# Patient Record
Sex: Male | Born: 1990 | Race: Black or African American | Hispanic: No | Marital: Single | Smoking: Never smoker
Health system: Southern US, Community
[De-identification: ages and names within clinical notes are randomized; demographics above are authoritative.]

## PROBLEM LIST (undated history)

## (undated) DIAGNOSIS — B029 Zoster without complications: Secondary | ICD-10-CM

## (undated) HISTORY — DX: Zoster without complications: B02.9

---
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
## 2008-07-24 ENCOUNTER — Emergency Department (HOSPITAL_BASED_OUTPATIENT_CLINIC_OR_DEPARTMENT_OTHER): Admission: EM | Admit: 2008-07-24 | Discharge: 2008-07-24 | Payer: Self-pay | Admitting: Emergency Medicine

## 2008-07-24 ENCOUNTER — Ambulatory Visit: Payer: Self-pay | Admitting: Diagnostic Radiology

## 2011-08-09 ENCOUNTER — Encounter (HOSPITAL_COMMUNITY): Payer: Self-pay | Admitting: *Deleted

## 2011-08-09 ENCOUNTER — Emergency Department (HOSPITAL_COMMUNITY)
Admission: EM | Admit: 2011-08-09 | Discharge: 2011-08-09 | Disposition: A | Discharge status: Home / Self Care | Payer: Medicaid Other | Attending: Emergency Medicine | Admitting: Emergency Medicine

## 2011-08-09 DIAGNOSIS — R51 Headache: Secondary | ICD-10-CM | POA: Insufficient documentation

## 2011-08-09 DIAGNOSIS — R5381 Other malaise: Secondary | ICD-10-CM | POA: Insufficient documentation

## 2011-08-09 DIAGNOSIS — R5383 Other fatigue: Secondary | ICD-10-CM | POA: Insufficient documentation

## 2011-08-09 DIAGNOSIS — B029 Zoster without complications: Secondary | ICD-10-CM | POA: Insufficient documentation

## 2011-08-09 MED ORDER — KETOROLAC TROMETHAMINE 60 MG/2ML IM SOLN
60.0000 mg | Freq: Once | INTRAMUSCULAR | Status: AC
  Administered 2011-08-09: 60 mg via INTRAMUSCULAR
  Filled 2011-08-09: qty 2

## 2011-08-09 MED ORDER — VALACYCLOVIR HCL 500 MG PO TABS
1000.0000 mg | ORAL_TABLET | Freq: Once | ORAL | Status: AC
  Administered 2011-08-09: 1000 mg via ORAL
  Filled 2011-08-09: qty 2

## 2011-08-09 MED ORDER — VALACYCLOVIR HCL 1 G PO TABS: 1000.0000 mg | ORAL_TABLET | Freq: Three times a day (TID) | ORAL | Status: AC

## 2011-08-09 NOTE — ED Notes (Signed)
Pt reports headache started yesterday and reports worse today. Pt reports pain to forehead. Pt has taken ibuprofen for pain without relief.  Pt also reports rash and pain to right abdomen wrapping around right back. Pt states rash started yesterday  Describes the pain as burning and stinging pain.  Pt repots history of chickenpox as a child and also reports he noted burning to this area prior to rash.  Pt has been taking prednisone which he had left over from an eye issue.

## 2011-08-09 NOTE — ED Notes (Signed)
PA student at bedside.

## 2011-08-09 NOTE — ED Provider Notes (Signed)
History     CSN: [id number]  Arrival date & time 08/09/11  1158   First MD Initiated Contact with Patient 08/09/11 1258      Chief Complaint  Patient presents with  . Herpes Zoster  . Headache    (Consider location/radiation/quality/duration/timing/severity/associated sxs/prior treatment) HPI Comments: 21 year old black male with 2 month history of daily prednisone use presents to the ED today with a painful rash on his back x 1 day and headache x 1 day. Notes fatigue and back pain starting yesterday with rash developing.  Describes rash as burning and stinging and worse with pressure to the area.  Denies fever.  Has had chickenpox as a child. Headache is frontal and not associated with vision changes.  No acute onset. Not worst headache of his life. No worsening vision. No trouble walking or vomiting. No fever. Has an eye condition of the left eye that causes blurring of the vision that is being treated with steroids by an ophthalmologist.   Patient is a 21 y.o. male presenting with headaches. The history is provided by the patient.  Headache  This is a new problem. The current episode started yesterday. The problem occurs constantly. The problem has not changed since onset.The headache is associated with nothing. The pain is located in the frontal region. The pain is at a severity of 4/10. The pain does not radiate. Pertinent negatives include no fever, no nausea and no vomiting. He has tried NSAIDs for the symptoms. The treatment provided no relief.    History reviewed. No pertinent past medical history.  History reviewed. No pertinent past surgical history.  No family history on file.  History  Substance Use Topics  . Smoking status: Never Smoker   . Smokeless tobacco: Not on file  . Alcohol Use: Yes     occ      Review of Systems  Constitutional: Positive for fatigue. Negative for fever, chills and appetite change.  HENT: Negative for sore throat, rhinorrhea and neck  stiffness.   Eyes: Negative for redness.  Respiratory: Negative for cough.   Cardiovascular: Negative for chest pain.  Gastrointestinal: Negative for nausea, vomiting, abdominal pain and diarrhea.  Genitourinary: Negative for dysuria.  Musculoskeletal: Positive for back pain. Negative for myalgias.  Skin: Positive for rash.  Neurological: Positive for headaches. Negative for dizziness and light-headedness.    Allergies  Review of patient's allergies indicates no known allergies.  Home Medications   Current Outpatient Rx  Name Route Sig Dispense Refill  . PREDNISONE 20 MG PO TABS Oral Take 20 mg by mouth daily.      BP 116/66  Pulse 100  Temp(Src) 98.2 F (36.8 C) (Oral)  Resp 18  Ht 5\' 11"  (1.803 m)  Wt 125 lb (56.7 kg)  BMI 17.43 kg/m2  SpO2 100%  Physical Exam  Nursing note and vitals reviewed. Constitutional: He is oriented to person, place, and time. He appears well-developed and well-nourished. No distress.  HENT:  Head: Normocephalic and atraumatic.  Right Ear: External ear normal.  Left Ear: External ear normal.  Eyes: Conjunctivae and EOM are normal. Pupils are equal, round, and reactive to light. Right eye exhibits no discharge. Left eye exhibits no discharge.  Neck: Normal range of motion. Neck supple.  Cardiovascular: Normal rate, regular rhythm and normal heart sounds.   No murmur heard. Pulmonary/Chest: Effort normal and breath sounds normal. No respiratory distress. He has no wheezes. He has no rales.  Abdominal: Soft. There is no tenderness.  Musculoskeletal:       Back:       Arms: Neurological: He is alert and oriented to person, place, and time. He has normal strength. No cranial nerve deficit or sensory deficit. Coordination and gait normal. GCS eye subscore is 4. GCS verbal subscore is 5. GCS motor subscore is 6.  Skin: Skin is warm and dry.       Broad vesicular rash with surrounding erythema that has a distinct dermatomal distribution from the  right middle abdomen and wrapping around the right flank to the back. Rash does not cross mid-line. Consistent with shingles.   Psychiatric: He has a normal mood and affect.    ED Course  Procedures (including critical care time)  Labs Reviewed - No data to display No results found.   1. Shingles     Patient seen and examined. Valtrex given in ED. Discussed with Dr. Juleen China regarding prednisone use.  Vital signs reviewed and are as follows: Filed Vitals:   08/09/11 1222  BP: 116/66  Pulse: 100  Temp: 98.2 F (36.8 C)  Resp: 18   1:47 PM Urged patient to follow up with primary care in one week for reevaluation symptoms. Counseled on possibility of postherpetic neuralgia. Patient will inform his ophthalmologist that he is being treated for shingles.   IM toradol for HA prior to d/c.     MDM  Shingles, less than 72 hrs. Patient is on prednisone. No eye involvement. Will treat with antivirals. First dose given in ED. Patient states he has rx drug coverage so I have rx valacyclovir for better compliance.   Headache: Frontal, no red flag symptoms. Normal neurological exam. Treated with toradol in ED. No h/o head injury.        Renne Crigler, [place] 08/09/11 1544

## 2011-08-09 NOTE — Discharge Instructions (Signed)
Please read and follow all provided instructions.  Your diagnoses today include:  1. Shingles     Tests performed today include:  Vital signs. See below for your results today.   Medications prescribed:   Valtrex - antibiotic that kills shingles virus  You have been prescribed an antiviral medicine: take the entire course of medicine even if you are feeling better. Stopping early can cause the antibiotic not to work.  Take any prescribed medications only as directed.  Home care instructions:  Follow any educational materials contained in this packet.  You should call your ophthamologist and let them know you are being treated for shingles.   Follow-up instructions: Please follow-up with your primary care provider in the next 1 week for further evaluation of your symptoms.   If you do not have a primary care doctor -- see below for referral information.   Return instructions:   Please return to the Emergency Department if you experience worsening symptoms.   Return with fever, vomiting  Please return if you have any other emergent concerns.  Additional Information:  Your vital signs today were: BP 116/66  Pulse 100  Temp(Src) 98.2 F (36.8 C) (Oral)  Resp 18  Ht 5\' 11"  (1.803 m)  Wt 125 lb (56.7 kg)  BMI 17.43 kg/m2  SpO2 100% If your blood pressure (BP) was elevated above 135/85 this visit, please have this repeated by your doctor within one month. -------------- No Primary Care Doctor Call [facility]  [phone] Other agencies that provide inexpensive medical care    [facility]  [phone]    [facility]  [phone]    [facility]  [phone]    [facility]  [phone]    [facility]  [phone]    [facility]  [phone] -------------- RESOURCE GUIDE:  Dental Problems  Patients with Medicaid: [facility] [phone] [address]                                             [phone] [address] Phone:  [phone]                                                   Phone:  [phone]  If unable to pay or uninsured, contact:  [facility] or [facility] to become qualified for the adult dental clinic.  Chronic Pain Problems Contact [facility]  [phone] Patients need to be referred by their primary care doctor.  Insufficient Money for Medicine Contact [employer]:  call "211" or [facility] [phone].  Psychological Services [facility]  [phone] [facility]  [phone] [facility]   [phone] (emergency services [phone])  Substance Abuse Resources [facility]  [phone] [facility] [phone] [facility] [phone] Floydene Flock [phone] Residential & Outpatient Substance Abuse Program  [phone]  Abuse/Neglect [facility] Child Abuse Hotline 613-126-3909 [facility] Child Abuse Hotline [phone] (After Hours)  Emergency Shelter [facility] [phone]  Maternity Homes Room at the  [facility] [place] [phone] [facility] [facility] [phone]  [facility] Resources  [facility]     [employer]                          [facility] Dept. [address] [place]                       [address]      [address]  Blondell Reveal Phone:  [phone]                                   Phone:  [phone]                 Phone:  [phone]  [facility] Phone:  [phone]  [facility] Child Abuse Hotline [phone] [phone] (After Hours)

## 2011-08-10 NOTE — ED Provider Notes (Signed)
Medical screening examination/treatment/procedure(s) were performed by non-physician practitioner and as supervising physician I was immediately available for consultation/collaboration.  Raeford Razor, MD 08/10/11 1017

## 2012-07-18 ENCOUNTER — Emergency Department (HOSPITAL_COMMUNITY): Payer: Medicaid Other

## 2012-07-18 ENCOUNTER — Emergency Department (HOSPITAL_COMMUNITY)
Admission: EM | Admit: 2012-07-18 | Discharge: 2012-07-18 | Disposition: A | Discharge status: Home / Self Care | Payer: Medicaid Other | Attending: Emergency Medicine | Admitting: Emergency Medicine

## 2012-07-18 ENCOUNTER — Encounter (HOSPITAL_COMMUNITY): Payer: Self-pay | Admitting: Emergency Medicine

## 2012-07-18 DIAGNOSIS — F1010 Alcohol abuse, uncomplicated: Secondary | ICD-10-CM | POA: Insufficient documentation

## 2012-07-18 DIAGNOSIS — R569 Unspecified convulsions: Secondary | ICD-10-CM | POA: Insufficient documentation

## 2012-07-18 DIAGNOSIS — F10929 Alcohol use, unspecified with intoxication, unspecified: Secondary | ICD-10-CM

## 2012-07-18 LAB — COMPREHENSIVE METABOLIC PANEL
AST: 24 U/L (ref 0–37)
Albumin: 4.4 g/dL (ref 3.5–5.2)
Alkaline Phosphatase: 91 U/L (ref 39–117)
BUN: 12 mg/dL (ref 6–23)
CO2: 26 mEq/L (ref 19–32)
Chloride: 99 mEq/L (ref 96–112)
Creatinine, Ser: 0.86 mg/dL (ref 0.50–1.35)
GFR calc non Af Amer: >90 mL/min (ref >90)
Potassium: 3.5 mEq/L (ref 3.5–5.1)
Total Bilirubin: 1.2 mg/dL (ref 0.3–1.2)

## 2012-07-18 LAB — CBC WITH DIFFERENTIAL/PLATELET
Basophils Relative: 0 % (ref 0–1)
HCT: 38.9 % — ABNORMAL LOW (ref 39.0–52.0)
Hemoglobin: 13.6 g/dL (ref 13.0–17.0)
Lymphocytes Relative: 15 % (ref 12–46)
Monocytes Absolute: 0.4 10*3/uL (ref 0.1–1.0)
Monocytes Relative: 5 % (ref 3–12)
Neutro Abs: 5.9 10*3/uL (ref 1.7–7.7)
Neutrophils Relative %: 79 % — ABNORMAL HIGH (ref 43–77)
RBC: 4.4 MIL/uL (ref 4.22–5.81)
WBC: 7.5 10*3/uL (ref 4.0–10.5)

## 2012-07-18 LAB — RAPID URINE DRUG SCREEN, HOSP PERFORMED
Amphetamines: NOT DETECTED
Opiates: NOT DETECTED
Tetrahydrocannabinol: NOT DETECTED

## 2012-07-18 LAB — GLUCOSE, CAPILLARY: Glucose-Capillary: 99 mg/dL (ref 70–99)

## 2012-07-18 MED ORDER — SODIUM CHLORIDE 0.9 % IV BOLUS (SEPSIS)
1000.0000 mL | Freq: Once | INTRAVENOUS | Status: AC
  Administered 2012-07-18: 1000 mL via INTRAVENOUS

## 2012-07-18 NOTE — ED Provider Notes (Signed)
History     CSN: [id number]  Arrival date & time 07/18/12  0613   First MD Initiated Contact with Patient 07/18/12 [phone]      Chief Complaint  Patient presents with  . Alcohol Intoxication    (Consider location/radiation/quality/duration/timing/severity/associated sxs/prior treatment) HPI Comments: Patient arrives via EMS with alcohol intoxication. Patient's friend states he arrives to his house about one hour ago and patient was highly intoxicated. Patient appeared having a seizure with shaking of his upper and lower extremity deformity of the mouth. This lasted for several seconds. It resolved on its own. Friend denies any illicit drug use. No other medical problems or known seizure history. Friend states the patient may have had a heart problem he was younger. Patient is obtunded and unable to provide any history.  The history is provided by the EMS personnel and a friend. The history is limited by the condition of the patient.    History reviewed. No pertinent past medical history.  History reviewed. No pertinent past surgical history.  History reviewed. No pertinent family history.  History  Substance Use Topics  . Smoking status: Never Smoker   . Smokeless tobacco: Not on file  . Alcohol Use: Yes     Comment: occ      Review of Systems  Unable to perform ROS: Mental status change    Allergies  Review of patient's allergies indicates no known allergies.  Home Medications   Current Outpatient Rx  Name  Route  Sig  Dispense  Refill  . PRESCRIPTION MEDICATION   Oral   Take 1 tablet by mouth every morning. "Heart medication"           BP 106/44  Pulse 85  Temp(Src) 98.5 F (36.9 C) (Oral)  Resp 17  SpO2 96%  Physical Exam  Constitutional: He is oriented to person, place, and time. He appears well-developed and well-nourished. No distress.  Obtunded, responds to painful stimuli, protects airway  HENT:  Head: Normocephalic and atraumatic.  Mouth/Throat:  Oropharynx is clear and moist. No oropharyngeal exudate.  Eyes: Conjunctivae and EOM are normal. Pupils are equal, round, and reactive to light.  Pupils 5 mm bilaterally and reactive  Neck: Normal range of motion. Neck supple.  Cardiovascular: Normal rate, regular rhythm and normal heart sounds.   No murmur heard. Pulmonary/Chest: Effort normal and breath sounds normal. No respiratory distress.  Abdominal: Soft. There is no tenderness. There is no rebound and no guarding.  Musculoskeletal: Normal range of motion. He exhibits no edema and no tenderness.  Neurological: He is alert and oriented to person, place, and time. No cranial nerve deficit. He exhibits normal muscle tone. Coordination normal.  Moves all extremities and follows some commands  Skin: Skin is warm.    ED Course  Procedures (including critical care time)  Labs Reviewed  CBC WITH DIFFERENTIAL - Abnormal; Notable for the following:    HCT 38.9 (*)    Neutrophils Relative 79 (*)    All other components within normal limits  COMPREHENSIVE METABOLIC PANEL - Abnormal; Notable for the following:    Glucose, Bld 100 (*)    All other components within normal limits  ETHANOL - Abnormal; Notable for the following:    Alcohol, Ethyl (B) 195 (*)    All other components within normal limits  URINE RAPID DRUG SCREEN (HOSP PERFORMED)   Ct Head Wo Contrast  07/18/2012  *RADIOLOGY REPORT*  Clinical Data: Intoxicated, possible seizure  CT HEAD WITHOUT CONTRAST  Technique:  Contiguous axial images were obtained from the base of the skull through the vertex without contrast.  Comparison: 08/28/2007  Findings: There is no evidence of acute intracranial hemorrhage, brain edema, mass lesion, acute infarction,   mass effect, or midline shift. Acute infarct may be inapparent on noncontrast CT. No other intra-axial abnormalities are seen, and the ventricles and sulci are within normal limits in size and symmetry.   No abnormal extra-axial fluid  collections or masses are identified.  No significant calvarial abnormality.  IMPRESSION: 1. Negative for bleed or other acute intracranial process.   Original Report Authenticated By: D. Andria Rhein, MD      No diagnosis found.    MDM  Mental status change with alcohol intoxication and questionable seizure activity. Vital stable, no distress, patient protecting airway  CBG, EKG, head CT  Reassessed 850.  Awake and alert, tolerating PO.  States he has had "seizures" before when drinking heavily. Unsteady gait at this time.  Reassess 12:30 PM. Patient is awake alert and oriented x3. He is ambulatory without assistance. He is tolerating by mouth. He denies complaint. feels ready to go home.   Date: 07/18/2012  Rate: 85  Rhythm: normal sinus rhythm  QRS Axis: normal  Intervals: normal  ST/T Wave abnormalities: normal  Conduction Disutrbances:none  Narrative Interpretation:   Old EKG Reviewed: none available      Glynn Octave, MD 07/18/12 1551

## 2012-07-18 NOTE — ED Notes (Signed)
Pt ambulated in hallway and back to pt's room without needing assistance.  Made EDP Rancour aware.

## 2012-07-18 NOTE — ED Notes (Signed)
Pt arrived via EMS with a chief complaint of ETOH intoxication.  Pt has been drinking at a friends house and his friends stated that he had what appeared to be "siezure activity."  Pt was said to be foaming at the mouth and shaking.  Per EMS patient was incoherent for a time but was arousable and able to respond to questions.

## 2012-10-17 ENCOUNTER — Encounter (HOSPITAL_COMMUNITY)
Admission: EM | Disposition: A | Discharge status: Home / Self Care | Payer: Self-pay | Source: Home / Self Care | Attending: Emergency Medicine

## 2012-10-17 ENCOUNTER — Observation Stay (HOSPITAL_COMMUNITY)
Admission: EM | Admit: 2012-10-17 | Discharge: 2012-10-18 | Disposition: A | Discharge status: Home / Self Care | Payer: Medicaid Other | Attending: General Surgery | Admitting: General Surgery

## 2012-10-17 ENCOUNTER — Encounter (HOSPITAL_COMMUNITY): Payer: Self-pay | Admitting: *Deleted

## 2012-10-17 ENCOUNTER — Emergency Department (HOSPITAL_COMMUNITY): Payer: Medicaid Other | Admitting: Anesthesiology

## 2012-10-17 ENCOUNTER — Emergency Department (HOSPITAL_COMMUNITY): Payer: Medicaid Other

## 2012-10-17 ENCOUNTER — Encounter (HOSPITAL_COMMUNITY): Payer: Self-pay | Admitting: Anesthesiology

## 2012-10-17 DIAGNOSIS — S0180XA Unspecified open wound of other part of head, initial encounter: Secondary | ICD-10-CM | POA: Insufficient documentation

## 2012-10-17 DIAGNOSIS — R55 Syncope and collapse: Secondary | ICD-10-CM

## 2012-10-17 DIAGNOSIS — S0181XA Laceration without foreign body of other part of head, initial encounter: Secondary | ICD-10-CM

## 2012-10-17 DIAGNOSIS — K3580 Unspecified acute appendicitis: Secondary | ICD-10-CM

## 2012-10-17 DIAGNOSIS — W19XXXA Unspecified fall, initial encounter: Secondary | ICD-10-CM | POA: Insufficient documentation

## 2012-10-17 HISTORY — PX: LAPAROSCOPIC APPENDECTOMY: SHX408

## 2012-10-17 LAB — URINALYSIS W MICROSCOPIC + REFLEX CULTURE
Leukocytes, UA: NEGATIVE
Nitrite: NEGATIVE
Specific Gravity, Urine: 1.014 (ref 1.005–1.030)
Urobilinogen, UA: 1 mg/dL (ref 0.0–1.0)
pH: 7.5 (ref 5.0–8.0)

## 2012-10-17 LAB — COMPREHENSIVE METABOLIC PANEL
ALT: 38 U/L (ref 0–53)
AST: 74 U/L — ABNORMAL HIGH (ref 0–37)
Albumin: 4.1 g/dL (ref 3.5–5.2)
CO2: 28 mEq/L (ref 19–32)
Chloride: 99 mEq/L (ref 96–112)
GFR calc non Af Amer: >90 mL/min (ref >90)
Sodium: 135 mEq/L (ref 135–145)
Total Bilirubin: 1.8 mg/dL — ABNORMAL HIGH (ref 0.3–1.2)

## 2012-10-17 LAB — CBC WITH DIFFERENTIAL/PLATELET
Basophils Absolute: 0 10*3/uL (ref 0.0–0.1)
Basophils Relative: 0 % (ref 0–1)
HCT: 40 % (ref 39.0–52.0)
Lymphocytes Relative: 8 % — ABNORMAL LOW (ref 12–46)
MCHC: 34.3 g/dL (ref 30.0–36.0)
Neutro Abs: 11 10*3/uL — ABNORMAL HIGH (ref 1.7–7.7)
Neutrophils Relative %: 82 % — ABNORMAL HIGH (ref 43–77)
Platelets: 247 10*3/uL (ref 150–400)
RDW: 11.9 % (ref 11.5–15.5)
WBC: 13.5 10*3/uL — ABNORMAL HIGH (ref 4.0–10.5)

## 2012-10-17 LAB — GLUCOSE, CAPILLARY: Glucose-Capillary: 86 mg/dL (ref 70–99)

## 2012-10-17 LAB — ETHANOL: Alcohol, Ethyl (B): <11 mg/dL (ref 0–11)

## 2012-10-17 LAB — TROPONIN I: Troponin I: <0.3 ng/mL (ref <0.30)

## 2012-10-17 LAB — RAPID URINE DRUG SCREEN, HOSP PERFORMED: Barbiturates: NOT DETECTED

## 2012-10-17 SURGERY — APPENDECTOMY, LAPAROSCOPIC
Anesthesia: General | Site: Abdomen | Wound class: Contaminated

## 2012-10-17 MED ORDER — ONDANSETRON HCL 4 MG PO TABS: 4.0000 mg | ORAL_TABLET | Freq: Four times a day (QID) | ORAL | Status: DC | PRN

## 2012-10-17 MED ORDER — PIPERACILLIN-TAZOBACTAM 3.375 G IVPB 30 MIN
3.3750 g | Freq: Once | INTRAVENOUS | Status: AC
  Administered 2012-10-17: 3.375 g via INTRAVENOUS
  Filled 2012-10-17: qty 50

## 2012-10-17 MED ORDER — PROMETHAZINE HCL 25 MG/ML IJ SOLN: 6.2500 mg | INTRAMUSCULAR | Status: DC | PRN

## 2012-10-17 MED ORDER — MEPERIDINE HCL 50 MG/ML IJ SOLN: 6.2500 mg | INTRAMUSCULAR | Status: DC | PRN

## 2012-10-17 MED ORDER — SODIUM CHLORIDE 0.9 % IV SOLN: 250.0000 mL | INTRAVENOUS | Status: DC | PRN

## 2012-10-17 MED ORDER — HEPARIN SODIUM (PORCINE) 5000 UNIT/ML IJ SOLN
5000.0000 [IU] | Freq: Three times a day (TID) | INTRAMUSCULAR | Status: DC
  Administered 2012-10-18 (×2): 5000 [IU] via SUBCUTANEOUS
  Filled 2012-10-17 (×4): qty 1

## 2012-10-17 MED ORDER — LACTATED RINGERS IV BOLUS (SEPSIS): 1000.0000 mL | Freq: Three times a day (TID) | INTRAVENOUS | Status: DC | PRN

## 2012-10-17 MED ORDER — FAMOTIDINE IN NACL 20-0.9 MG/50ML-% IV SOLN
20.0000 mg | Freq: Once | INTRAVENOUS | Status: AC
  Administered 2012-10-17: 20 mg via INTRAVENOUS
  Filled 2012-10-17: qty 50

## 2012-10-17 MED ORDER — LIP MEDEX EX OINT
1.0000 "application " | TOPICAL_OINTMENT | Freq: Two times a day (BID) | CUTANEOUS | Status: DC
  Administered 2012-10-17 – 2012-10-18 (×2): 1 via TOPICAL
  Filled 2012-10-17: qty 7

## 2012-10-17 MED ORDER — EPHEDRINE SULFATE 50 MG/ML IJ SOLN
INTRAMUSCULAR | Status: DC | PRN
  Administered 2012-10-17: 5 mg via INTRAVENOUS

## 2012-10-17 MED ORDER — ROCURONIUM BROMIDE 100 MG/10ML IV SOLN
INTRAVENOUS | Status: DC | PRN
  Administered 2012-10-17: 10 mg via INTRAVENOUS
  Administered 2012-10-17: 20 mg via INTRAVENOUS

## 2012-10-17 MED ORDER — IOHEXOL 300 MG/ML  SOLN
100.0000 mL | Freq: Once | INTRAMUSCULAR | Status: AC | PRN
  Administered 2012-10-17: 100 mL via INTRAVENOUS

## 2012-10-17 MED ORDER — ACETAMINOPHEN 500 MG PO TABS
1000.0000 mg | ORAL_TABLET | Freq: Three times a day (TID) | ORAL | Status: DC
  Administered 2012-10-17: 1000 mg via ORAL
  Filled 2012-10-17: qty 1
  Filled 2012-10-17 (×3): qty 2

## 2012-10-17 MED ORDER — DEXTROSE IN LACTATED RINGERS 5 % IV SOLN
INTRAVENOUS | Status: DC
  Administered 2012-10-18: 05:00:00 via INTRAVENOUS

## 2012-10-17 MED ORDER — MIDAZOLAM HCL 2 MG/2ML IJ SOLN: 0.5000 mg | Freq: Once | INTRAMUSCULAR | Status: DC | PRN

## 2012-10-17 MED ORDER — SODIUM CHLORIDE 0.9 % IJ SOLN: 3.0000 mL | Freq: Two times a day (BID) | INTRAMUSCULAR | Status: DC

## 2012-10-17 MED ORDER — SODIUM CHLORIDE 0.9 % IV SOLN
INTRAVENOUS | Status: DC
  Administered 2012-10-17: 14:00:00 via INTRAVENOUS

## 2012-10-17 MED ORDER — LACTATED RINGERS IR SOLN
Status: DC | PRN
  Administered 2012-10-17: 3000 mL

## 2012-10-17 MED ORDER — HYDROMORPHONE HCL PF 1 MG/ML IJ SOLN
0.5000 mg | INTRAMUSCULAR | Status: DC | PRN
  Administered 2012-10-17: 1 mg via INTRAVENOUS
  Filled 2012-10-17: qty 2

## 2012-10-17 MED ORDER — HYDROMORPHONE HCL PF 1 MG/ML IJ SOLN: 0.5000 mg | INTRAMUSCULAR | Status: DC | PRN

## 2012-10-17 MED ORDER — PROPOFOL 10 MG/ML IV BOLUS
INTRAVENOUS | Status: DC | PRN
  Administered 2012-10-17: 150 mg via INTRAVENOUS

## 2012-10-17 MED ORDER — MAGIC MOUTHWASH
15.0000 mL | Freq: Four times a day (QID) | ORAL | Status: DC | PRN
  Filled 2012-10-17: qty 15

## 2012-10-17 MED ORDER — DEXTROSE 5 % IV SOLN: 2.0000 g | INTRAVENOUS | Status: DC

## 2012-10-17 MED ORDER — 0.9 % SODIUM CHLORIDE (POUR BTL) OPTIME
TOPICAL | Status: DC | PRN
  Administered 2012-10-17: 1000 mL

## 2012-10-17 MED ORDER — NEOSTIGMINE METHYLSULFATE 1 MG/ML IJ SOLN
INTRAMUSCULAR | Status: DC | PRN
  Administered 2012-10-17: 4 mg via INTRAVENOUS

## 2012-10-17 MED ORDER — DEXTROSE 5 % IV SOLN
2.0000 g | Freq: Once | INTRAVENOUS | Status: AC
  Administered 2012-10-17: 2 g via INTRAVENOUS
  Filled 2012-10-17: qty 2

## 2012-10-17 MED ORDER — SUCCINYLCHOLINE CHLORIDE 20 MG/ML IJ SOLN
INTRAMUSCULAR | Status: DC | PRN
  Administered 2012-10-17: 100 mg via INTRAVENOUS

## 2012-10-17 MED ORDER — METRONIDAZOLE IN NACL 5-0.79 MG/ML-% IV SOLN
500.0000 mg | Freq: Three times a day (TID) | INTRAVENOUS | Status: DC
  Administered 2012-10-17 – 2012-10-18 (×2): 500 mg via INTRAVENOUS
  Filled 2012-10-17 (×4): qty 100

## 2012-10-17 MED ORDER — LACTATED RINGERS IV SOLN
INTRAVENOUS | Status: DC | PRN
  Administered 2012-10-17: 17:00:00 via INTRAVENOUS

## 2012-10-17 MED ORDER — FENTANYL CITRATE 0.05 MG/ML IJ SOLN
INTRAMUSCULAR | Status: DC | PRN
  Administered 2012-10-17 (×2): 50 ug via INTRAVENOUS

## 2012-10-17 MED ORDER — ONDANSETRON HCL 4 MG/2ML IJ SOLN
INTRAMUSCULAR | Status: DC | PRN
  Administered 2012-10-17: 4 mg via INTRAVENOUS

## 2012-10-17 MED ORDER — MIDAZOLAM HCL 5 MG/5ML IJ SOLN
INTRAMUSCULAR | Status: DC | PRN
  Administered 2012-10-17: 1 mg via INTRAVENOUS

## 2012-10-17 MED ORDER — KETOROLAC TROMETHAMINE 30 MG/ML IJ SOLN: 15.0000 mg | Freq: Once | INTRAMUSCULAR | Status: DC | PRN

## 2012-10-17 MED ORDER — KETOROLAC TROMETHAMINE 30 MG/ML IJ SOLN
INTRAMUSCULAR | Status: DC | PRN
  Administered 2012-10-17: 30 mg via INTRAVENOUS

## 2012-10-17 MED ORDER — OXYCODONE HCL 5 MG PO TABS: 5.0000 mg | ORAL_TABLET | ORAL | Status: DC | PRN

## 2012-10-17 MED ORDER — LACTATED RINGERS IV BOLUS (SEPSIS)
1000.0000 mL | Freq: Once | INTRAVENOUS | Status: AC
  Administered 2012-10-17: 1000 mL via INTRAVENOUS

## 2012-10-17 MED ORDER — FENTANYL CITRATE 0.05 MG/ML IJ SOLN: 25.0000 ug | INTRAMUSCULAR | Status: DC | PRN

## 2012-10-17 MED ORDER — BISACODYL 10 MG RE SUPP: 10.0000 mg | Freq: Two times a day (BID) | RECTAL | Status: DC | PRN

## 2012-10-17 MED ORDER — SODIUM CHLORIDE 0.9 % IJ SOLN: 3.0000 mL | INTRAMUSCULAR | Status: DC | PRN

## 2012-10-17 MED ORDER — DEXTROSE 5 % IV SOLN
2.0000 g | INTRAVENOUS | Status: DC
  Filled 2012-10-17 (×2): qty 2

## 2012-10-17 MED ORDER — ALUM & MAG HYDROXIDE-SIMETH 200-200-20 MG/5ML PO SUSP: 30.0000 mL | Freq: Four times a day (QID) | ORAL | Status: DC | PRN

## 2012-10-17 MED ORDER — PIPERACILLIN-TAZOBACTAM 4.5 G IVPB: 4.5000 g | Freq: Once | INTRAVENOUS | Status: DC

## 2012-10-17 MED ORDER — METRONIDAZOLE IN NACL 5-0.79 MG/ML-% IV SOLN: 500.0000 mg | Freq: Four times a day (QID) | INTRAVENOUS | Status: DC

## 2012-10-17 MED ORDER — LIDOCAINE HCL (CARDIAC) 20 MG/ML IV SOLN
INTRAVENOUS | Status: DC | PRN
  Administered 2012-10-17: 80 mg via INTRAVENOUS

## 2012-10-17 MED ORDER — GLYCOPYRROLATE 0.2 MG/ML IJ SOLN
INTRAMUSCULAR | Status: DC | PRN
  Administered 2012-10-17: .6 mg via INTRAVENOUS

## 2012-10-17 MED ORDER — DIPHENHYDRAMINE HCL 50 MG/ML IJ SOLN: 12.5000 mg | Freq: Four times a day (QID) | INTRAMUSCULAR | Status: DC | PRN

## 2012-10-17 MED ORDER — PROMETHAZINE HCL 25 MG/ML IJ SOLN: 12.5000 mg | Freq: Four times a day (QID) | INTRAMUSCULAR | Status: DC | PRN

## 2012-10-17 MED ORDER — ONDANSETRON HCL 4 MG/2ML IJ SOLN
4.0000 mg | Freq: Four times a day (QID) | INTRAMUSCULAR | Status: DC | PRN
  Administered 2012-10-17: 4 mg via INTRAVENOUS
  Filled 2012-10-17: qty 2

## 2012-10-17 MED ORDER — OXYCODONE HCL 5 MG PO TABS
5.0000 mg | ORAL_TABLET | ORAL | Status: DC | PRN
  Administered 2012-10-18 (×2): 5 mg via ORAL
  Filled 2012-10-17 (×2): qty 1

## 2012-10-17 MED ORDER — BUPIVACAINE-EPINEPHRINE PF 0.25-1:200000 % IJ SOLN
INTRAMUSCULAR | Status: AC
  Filled 2012-10-17: qty 30

## 2012-10-17 MED ORDER — ONDANSETRON HCL 4 MG/2ML IJ SOLN: 4.0000 mg | Freq: Four times a day (QID) | INTRAMUSCULAR | Status: DC | PRN

## 2012-10-17 MED ORDER — BUPIVACAINE-EPINEPHRINE 0.25% -1:200000 IJ SOLN
INTRAMUSCULAR | Status: DC | PRN
  Administered 2012-10-17: 30 mL

## 2012-10-17 SURGICAL SUPPLY — 38 items
APPLIER CLIP 5 13 M/L LIGAMAX5 (MISCELLANEOUS)
APPLIER CLIP ROT 10 11.4 M/L (STAPLE)
CANISTER SUCTION 2500CC (MISCELLANEOUS) ×2 IMPLANT
CLIP APPLIE 5 13 M/L LIGAMAX5 (MISCELLANEOUS) IMPLANT
CLIP APPLIE ROT 10 11.4 M/L (STAPLE) IMPLANT
CLOTH BEACON ORANGE TIMEOUT ST (SAFETY) ×2 IMPLANT
CUTTER FLEX LINEAR 45M (STAPLE) ×2 IMPLANT
DECANTER SPIKE VIAL GLASS SM (MISCELLANEOUS) ×2 IMPLANT
DRAPE LAPAROSCOPIC ABDOMINAL (DRAPES) ×2 IMPLANT
DRAPE UTILITY XL STRL (DRAPES) ×2 IMPLANT
DRSG TEGADERM 2-3/8X2-3/4 SM (GAUZE/BANDAGES/DRESSINGS) ×4 IMPLANT
DRSG TEGADERM 4X4.75 (GAUZE/BANDAGES/DRESSINGS) ×2 IMPLANT
ELECT REM PT RETURN 9FT ADLT (ELECTROSURGICAL) ×2
ELECTRODE REM PT RTRN 9FT ADLT (ELECTROSURGICAL) ×1 IMPLANT
ENDOLOOP SUT PDS II  0 18 (SUTURE) ×1
ENDOLOOP SUT PDS II 0 18 (SUTURE) ×1 IMPLANT
GLOVE BIOGEL PI IND STRL 7.0 (GLOVE) ×1 IMPLANT
GLOVE BIOGEL PI INDICATOR 7.0 (GLOVE) ×1
GLOVE ECLIPSE 8.0 STRL XLNG CF (GLOVE) ×2 IMPLANT
GLOVE INDICATOR 8.0 STRL GRN (GLOVE) ×4 IMPLANT
GOWN STRL NON-REIN LRG LVL3 (GOWN DISPOSABLE) ×2 IMPLANT
GOWN STRL REIN XL XLG (GOWN DISPOSABLE) ×4 IMPLANT
KIT BASIN OR (CUSTOM PROCEDURE TRAY) ×2 IMPLANT
NS IRRIG 1000ML POUR BTL (IV SOLUTION) ×2 IMPLANT
PENCIL BUTTON HOLSTER BLD 10FT (ELECTRODE) ×2 IMPLANT
POUCH SPECIMEN RETRIEVAL 10MM (ENDOMECHANICALS) ×2 IMPLANT
RELOAD 45 VASCULAR/THIN (ENDOMECHANICALS) IMPLANT
RELOAD STAPLE TA45 3.5 REG BLU (ENDOMECHANICALS) ×2 IMPLANT
SET IRRIG TUBING LAPAROSCOPIC (IRRIGATION / IRRIGATOR) ×2 IMPLANT
SOLUTION ANTI FOG 6CC (MISCELLANEOUS) ×2 IMPLANT
SUT MNCRL AB 4-0 PS2 18 (SUTURE) ×2 IMPLANT
TOWEL OR 17X26 10 PK STRL BLUE (TOWEL DISPOSABLE) ×2 IMPLANT
TRAY FOLEY CATH 14FRSI W/METER (CATHETERS) ×2 IMPLANT
TRAY LAP CHOLE (CUSTOM PROCEDURE TRAY) ×2 IMPLANT
TROCAR BLADELESS OPT 5 100 (ENDOMECHANICALS) ×2 IMPLANT
TROCAR XCEL 12X100 BLDLESS (ENDOMECHANICALS) ×2 IMPLANT
TROCAR XCEL BLADELESS 5X75MML (TROCAR) ×2 IMPLANT
TUBING INSUFFLATION 10FT LAP (TUBING) ×2 IMPLANT

## 2012-10-17 NOTE — ED Provider Notes (Signed)
Pt seen and treated by Dr Clarene Duke.     LACERATION REPAIR Performed by: Trixie Dredge B  Performed by Dareen Piano, PA-S, under my supervision.  Authorized by: Trixie Dredge B Consent: Verbal consent obtained. Risks and benefits: risks, benefits and alternatives were discussed Consent given by: patient Patient identity confirmed: provided demographic data Prepped and Draped in normal sterile fashion Wound explored  Laceration Location: right eyebrow  Laceration Length: 1cm  No Foreign Bodies seen or palpated  Anesthesia: local infiltration  Local anesthetic: lidocaine 2% no epinephrine  Anesthetic total: 1 ml  Irrigation method: syringe Amount of cleaning: standard  Skin closure: 5-0 prolene  Number of sutures: 3  Technique: simple interrupted  Patient tolerance: Patient tolerated the procedure well with no immediate complications.   Trixie Dredge, PA-C 10/17/12 1510

## 2012-10-17 NOTE — Anesthesia Preprocedure Evaluation (Addendum)
Anesthesia Evaluation  Patient identified by MRN, date of birth, ID band Patient awake    Reviewed: Allergy & Precautions, H&P , Patient's Chart, lab work & pertinent test results, reviewed documented beta blocker date and time   History of Anesthesia Complications Negative for: history of anesthetic complications  Airway Mallampati: II TM Distance: >3 FB Neck ROM: full    Dental no notable dental hx.    Pulmonary neg pulmonary ROS,  breath sounds clear to auscultation  Pulmonary exam normal       Cardiovascular Exercise Tolerance: Good negative cardio ROS  Rhythm:regular Rate:Normal     Neuro/Psych negative neurological ROS  negative psych ROS   GI/Hepatic negative GI ROS, Neg liver ROS,   Endo/Other  negative endocrine ROS  Renal/GU negative Renal ROS     Musculoskeletal   Abdominal   Peds  Hematology negative hematology ROS (+)   Anesthesia Other Findings   Reproductive/Obstetrics negative OB ROS                           Anesthesia Physical Anesthesia Plan  ASA: II and emergent  Anesthesia Plan: General ETT   Post-op Pain Management:    Induction:   Airway Management Planned:   Additional Equipment:   Intra-op Plan:   Post-operative Plan:   Informed Consent: I have reviewed the patients History and Physical, chart, labs and discussed the procedure including the risks, benefits and alternatives for the proposed anesthesia with the patient or authorized representative who has indicated his/her understanding and acceptance.   Dental Advisory Given  Plan Discussed with: CRNA and Surgeon  Anesthesia Plan Comments:         Anesthesia Quick Evaluation

## 2012-10-17 NOTE — ED Notes (Signed)
Pt presents to ed with c/o abdominal pain and syncopal episode. Pt sts abd.pain started last night after eating dinner and this morning while at friend's house he felt weak, sweaty and "then i passed out"

## 2012-10-17 NOTE — ED Notes (Signed)
Pt reports friend witnessed him pass out, pt hit the right side of his head on metal part of a bed. Small laceration noted to right eyebrow

## 2012-10-17 NOTE — Transfer of Care (Signed)
Immediate Anesthesia Transfer of Care Note  Patient: Jon Ibarra  Procedure(s) Performed: Procedure(s): APPENDECTOMY LAPAROSCOPIC (N/A)  Patient Location: PACU  Anesthesia Type:General  Level of Consciousness: awake, alert , oriented and patient cooperative  Airway & Oxygen Therapy: Patient Spontanous Breathing and Patient connected to face mask oxygen  Post-op Assessment: Report given to PACU RN, Post -op Vital signs reviewed and stable and Patient moving all extremities  Post vital signs: Reviewed and stable  Complications: No apparent anesthesia complications

## 2012-10-17 NOTE — ED Provider Notes (Signed)
History    CSN: [id number] Arrival date & time 10/17/12  1210  First MD Initiated Contact with Patient 10/17/12 1240     Chief Complaint  Patient presents with  . Loss of Consciousness  . Abdominal Pain    HPI Pt was seen at 1245.   Per pt, c/o gradual onset and persistence of constant generalized abd "pain" since yesterday.  Describes the abd pain as "sharp."  Denies N/V/D, no fevers, no back pain, no rash, no CP/SOB, no black or blood in stools. States his last PO intake was yesterday night. Pt also c/o sudden onset and resolution of one brief episode of syncope that occurred today PTA. Pt states he had just finished using the bathroom (urinating) and then felt generally weak and lightheaded. Pt's friend states pt "just passed out," sustaining a small laceration to his right eyebrow area. Pt woke up quickly. No seizure activity, no incont of bowel/bladder, no AMS/confusion, no palpitations, no neck or back pain, no focal motor weakness, no tingling/numbness in extremities, no headache, no visual changes.     History reviewed. No pertinent past medical history.  History reviewed. No pertinent past surgical history.  History  Substance Use Topics  . Smoking status: Never Smoker   . Smokeless tobacco: Not on file  . Alcohol Use: Yes     Comment: occ    Review of Systems ROS: Statement: All systems negative except as marked or noted in the HPI; Constitutional: Negative for fever and chills. ; ; Eyes: Negative for eye pain, redness and discharge. ; ; ENMT: Negative for ear pain, hoarseness, nasal congestion, sinus pressure and sore throat. ; ; Cardiovascular: Negative for chest pain, palpitations, diaphoresis, dyspnea and peripheral edema. ; ; Respiratory: Negative for cough, wheezing and stridor. ; ; Gastrointestinal: +abd pain. Negative for nausea, vomiting, diarrhea, blood in stool, hematemesis, jaundice and rectal bleeding. . ; ; Genitourinary: Negative for dysuria, flank pain and  hematuria. ; ; Musculoskeletal: Negative for back pain and neck pain. Negative for swelling and trauma.; ; Skin: +laceration. Negative for pruritus, rash, abrasions, blisters, bruising and skin lesion.; ; Neuro: Negative for headache and neck stiffness. Negative for altered level of consciousness , altered mental status, extremity weakness, paresthesias, involuntary movement, seizure and +lightheadedness, syncope.      Allergies  Review of patient's allergies indicates no known allergies.  Home Medications   Current Outpatient Rx  Name  Route  Sig  Dispense  Refill  . PRESCRIPTION MEDICATION   Oral   Take 1 tablet by mouth every morning. "Heart medication"          BP 115/67  Pulse 100  Temp(Src) 97.8 F (36.6 C) (Oral)  Resp 23  SpO2 100% Physical Exam 1250: Physical examination: Vital signs and O2 SAT: Reviewed; Constitutional: Well developed, Well nourished, Well hydrated, Uncomfortable appearing; Head and Face: Normocephalic, +approx 1cm vertical linear hemostatic laceration right eyebrow. No scalp hematomas.  Non-tender to palp superior and inferior orbital rim areas.  No zygoma tenderness.  No mandibular tenderness.; Eyes: EOMI, PERRL, No scleral icterus. No obvious hyphema or hypopyon.; ENMT: Mouth and pharynx normal, Left TM normal, Right TM normal, Mucous membranes moist, +teeth and tongue intact.  No intraoral or intranasal bleeding.  No septal hematomas.  No trismus, no malocclusion.; Neck: Supple, Full range of motion, No lymphadenopathy; Spine: No midline CS, TS, LS tenderness.; Cardiovascular: Regular rate and rhythm, No murmur, rub, or gallop; Respiratory: Breath sounds clear & equal bilaterally, No rales, rhonchi,  wheezes, Normal respiratory effort/excursion; Chest: Nontender, No deformity, Movement normal, No crepitus, No abrasions or ecchymosis.; Abdomen: Soft, +diffusely tender to palp.  Nondistended, Normal bowel sounds, No abrasions or ecchymosis.; Genitourinary: No CVA  tenderness;; Extremities: No deformity, Full range of motion major/large joints of bilat UE's and LE's without pain or tenderness to palp, Neurovascularly intact, Pulses normal, No tenderness, No edema, Pelvis stable; Neuro: AA&Ox3, GCS 15.  Major CN grossly intact. Speech clear. No facial droop. No gross focal motor or sensory deficits in extremities.; Skin: Color normal, Warm, Dry   ED Course  Procedures    MDM  MDM Reviewed: previous chart, nursing note and vitals Reviewed previous: labs and ECG Interpretation: labs, x-ray, CT scan and ECG    Date: 10/17/2012  Rate: 80  Rhythm: normal sinus rhythm  QRS Axis: normal  Intervals: normal  ST/T Wave abnormalities: early repolarization  Conduction Disutrbances:none  Narrative Interpretation:   Old EKG Reviewed: unchanged; no significant changes from previous EKG dated 07/18/2012.  Results for orders placed during the hospital encounter of 10/17/12  URINE RAPID DRUG SCREEN (HOSP PERFORMED)      Result Value Range   Opiates NONE DETECTED  NONE DETECTED   Cocaine NONE DETECTED  NONE DETECTED   Benzodiazepines NONE DETECTED  NONE DETECTED   Amphetamines NONE DETECTED  NONE DETECTED   Tetrahydrocannabinol NONE DETECTED  NONE DETECTED   Barbiturates NONE DETECTED  NONE DETECTED  ETHANOL      Result Value Range   Alcohol, Ethyl (B) <11  0 - 11 mg/dL  URINALYSIS W MICROSCOPIC + REFLEX CULTURE      Result Value Range   Color, Urine YELLOW  YELLOW   APPearance CLEAR  CLEAR   Specific Gravity, Urine 1.014  1.005 - 1.030   pH 7.5  5.0 - 8.0   Glucose, UA NEGATIVE  NEGATIVE mg/dL   Hgb urine dipstick NEGATIVE  NEGATIVE   Bilirubin Urine NEGATIVE  NEGATIVE   Ketones, ur NEGATIVE  NEGATIVE mg/dL   Protein, ur NEGATIVE  NEGATIVE mg/dL   Urobilinogen, UA 1.0  0.0 - 1.0 mg/dL   Nitrite NEGATIVE  NEGATIVE   Leukocytes, UA NEGATIVE  NEGATIVE   WBC, UA 0-2  <3 WBC/hpf   RBC / HPF 0-2  <3 RBC/hpf  CBC WITH DIFFERENTIAL      Result Value  Range   WBC 13.5 (*) 4.0 - 10.5 K/uL   RBC 4.50  4.22 - 5.81 MIL/uL   Hemoglobin 13.7  13.0 - 17.0 g/dL   HCT 16.1  09.6 - 04.5 %   MCV 88.9  78.0 - 100.0 fL   MCH 30.4  26.0 - 34.0 pg   MCHC 34.3  30.0 - 36.0 g/dL   RDW 40.9  81.1 - 91.4 %   Platelets 247  150 - 400 K/uL   Neutrophils Relative % 82 (*) 43 - 77 %   Neutro Abs 11.0 (*) 1.7 - 7.7 K/uL   Lymphocytes Relative 8 (*) 12 - 46 %   Lymphs Abs 1.1  0.7 - 4.0 K/uL   Monocytes Relative 10  3 - 12 %   Monocytes Absolute 1.4 (*) 0.1 - 1.0 K/uL   Eosinophils Relative 1  0 - 5 %   Eosinophils Absolute 0.1  0.0 - 0.7 K/uL   Basophils Relative 0  0 - 1 %   Basophils Absolute 0.0  0.0 - 0.1 K/uL  COMPREHENSIVE METABOLIC PANEL      Result Value Range  Sodium 135  135 - 145 mEq/L   Potassium 3.8  3.5 - 5.1 mEq/L   Chloride 99  96 - 112 mEq/L   CO2 28  19 - 32 mEq/L   Glucose, Bld 100 (*) 70 - 99 mg/dL   BUN 8  6 - 23 mg/dL   Creatinine, Ser 6.96  0.50 - 1.35 mg/dL   Calcium 9.8  8.4 - 29.5 mg/dL   Total Protein 8.4 (*) 6.0 - 8.3 g/dL   Albumin 4.1  3.5 - 5.2 g/dL   AST 74 (*) 0 - 37 U/L   ALT 38  0 - 53 U/L   Alkaline Phosphatase 88  39 - 117 U/L   Total Bilirubin 1.8 (*) 0.3 - 1.2 mg/dL   GFR calc non Af Amer >90  >90 mL/min   GFR calc Af Amer >90  >90 mL/min  LIPASE, BLOOD      Result Value Range   Lipase 22  11 - 59 U/L  TROPONIN I      Result Value Range   Troponin I <0.30  <0.30 ng/mL  GLUCOSE, CAPILLARY      Result Value Range   Glucose-Capillary 86  70 - 99 mg/dL   Dg Chest 2 View 05/22/4130   *RADIOLOGY REPORT*  Clinical Data: loss of consciousness  CHEST - 2 VIEW  Comparison: None.  Findings: The heart size and mediastinal contours are within normal limits.  Both lungs are clear.  The visualized skeletal structures are unremarkable.  IMPRESSION: Negative exam.   Original Report Authenticated By: Signa Kell, M.D.   Ct Head Wo Contrast 10/17/2012   *RADIOLOGY REPORT*  Clinical Data: Abdominal pain, syncopal  episode  CT HEAD WITHOUT CONTRAST  Technique:  Contiguous axial images were obtained from the base of the skull through the vertex without contrast.  Comparison: Prior CT scan of the head 07/18/2012  Findings: No acute intracranial hemorrhage, acute infarction, mass lesion, mass effect, midline shift or hydrocephalus.  Gray-white differentiation is preserved throughout.  Normal aeration of the mastoid air cells and visualized paranasal sinuses.  No focal soft tissue or calvarial abnormality.  IMPRESSION: Negative noncontrasted head CT.   Original Report Authenticated By: Malachy Moan, M.D.   Ct Abdomen Pelvis W Contrast 10/17/2012   *RADIOLOGY REPORT*  Clinical Data: Abdominal pain and syncope.  CT ABDOMEN AND PELVIS WITH CONTRAST  Technique:  Multidetector CT imaging of the abdomen and pelvis was performed following the standard protocol during bolus administration of intravenous contrast.  Contrast: OMNIPAQUE IOHEXOL 300 MG/ML  SOLN  Comparison: None.  Findings: The appendix is distended to a diameter of 11 mm and shows intense enhancement.  There is no periappendiceal inflammation.  There is a small amount of free fluid in the pelvic cul-de-sac.  The liver, biliary tree, spleen, pancreas, adrenal glands, and kidneys are normal.  No dilated loops of large or small bowel.  IMPRESSION: Early acute appendicitis.  Free fluid in the pelvis.   Original Report Authenticated By: Francene Boyers, M.D.     1515:  Pt not orthostatic. Remains afebrile. Lac closed by midlevel provider. Will start IV abx for +appy on CT scan. Dx and testing d/w pt and family.  Questions answered.  Verb understanding, agreeable to admit.  T/C to General Surgery Dr. Michaell Cowing, case discussed, including:  HPI, pertinent PM/SHx, VS/PE, dx testing, ED course and treatment:  Agreeable to admit, requests he will eval pt in ED.      Laray Anger, DO  10/18/12 0739 

## 2012-10-17 NOTE — Op Note (Signed)
6:17 PM  PATIENT:  Jon Ibarra  22 y.o. male  Patient has no care team.  PRE-OPERATIVE DIAGNOSIS:  appendicitis  POST-OPERATIVE DIAGNOSIS:  acute appendicitis  PROCEDURE:  Procedure(s): APPENDECTOMY LAPAROSCOPIC  SURGEON:  Surgeon(s): Ardeth Sportsman, MD  ANESTHESIA:   local and general  EBL:  Total I/O In: 1000 [I.V.:1000] Out: -   Delay start of Pharmacological VTE agent (>24hrs) due to surgical blood loss or risk of bleeding:  no  DRAINS: none   SPECIMEN:  Source of Specimen:  Appendix  DISPOSITION OF SPECIMEN:  PATHOLOGY  COUNTS:  YES  PLAN OF CARE: Admit for overnight observation  PATIENT DISPOSITION:  PACU - hemodynamically stable.   INDICATIONS: Patient with concerning symptoms & work up suspicious for appendicitis.  Surgery was recommended:  The anatomy & physiology of the digestive tract was discussed.  The pathophysiology of appendicitis was discussed.  Natural history risks without surgery was discussed.   I feel the risks of no intervention will lead to serious problems that outweigh the operative risks; therefore, I recommended diagnostic laparoscopy with removal of appendix to remove the pathology.  Laparoscopic & open techniques were discussed.   I noted a good likelihood this will help address the problem.    Risks such as bleeding, infection, abscess, leak, reoperation, possible ostomy, hernia, heart attack, death, and other risks were discussed.  Goals of post-operative recovery were discussed as well.  We will work to minimize complications.  Questions were answered.  The patient expresses understanding & wishes to proceed with surgery.  OR FINDINGS: Early appendicitis with mild phlegmon - anterior positioning  DESCRIPTION:   The patient was identified & brought into the operating room. The patient was positioned supine with arms tucked. SCDs were active during the entire case. The patient underwent general anesthesia without any difficulty.   The abdomen was prepped and draped in a sterile fashion. A Surgical Timeout confirmed our plan.  I made a transverse incision through the inferior umbilical fold.  I made a small transverse nick through the infraumbilical fascia and confirmed peritoneal entry.  I placed a 5mm port.  We induced carbon dioxide insufflation.  Camera inspection revealed no injury.  I placed additional ports under direct laparoscopic visualization.  I located the appendix in the RLQ heading down into the pelvis.  I took care to avoid injuring any retroperitoneal structures.  I freed the appendix off its attachments to the ascending colon and cecal mesentery.  I elevated the appendix. I skeletonized the mesoappendix. I was able to free off the base of the appendix which was still viable.  I stapled the appendix off the cecum using a laparoscopic stapler. I took a healthy cuff of viable cecum. I ligated the mesoappendix and assured hemostasis in the mesentery. I removed the appendix out the 12 mm port.  I did copious irrigation. Hemostasis was good in the mesoappendix, colon mesentery, and retroperitoneum. Staple line was intact on the cecum with no bleeding. I washed out the pelvis, retrohepatic space and right paracolic gutter. I washed out the left side as well.  Mild oozing on staple line - pressure held & resolved within minutes.  Hemostasis is good. There was no perforation or injury. Because the area cleaned up well after irrigation, I did not place a drain.  I aspirated the carbon dioxide. I removed the ports. I closed the 12 mm fascia site using a 0 Vicryl stitch. I closed skin using 4-0 monocryl stitch.  Patient was extubated  and sent to the recovery room.  I am about to discuss with the patient's family. I suspect the patient is going used in the hospital at least overnight and will need antibiotics for 23 hours.

## 2012-10-17 NOTE — Preoperative (Signed)
Beta Blockers   Reason not to administer Beta Blockers:Not Applicable 

## 2012-10-17 NOTE — Anesthesia Postprocedure Evaluation (Signed)
  Anesthesia Post-op Note  Patient: Jon Ibarra  Procedure(s) Performed: Procedure(s): APPENDECTOMY LAPAROSCOPIC (N/A)  Patient Location: PACU  Anesthesia Type:General  Level of Consciousness: awake, alert  and oriented  Airway and Oxygen Therapy: Patient Spontanous Breathing  Post-op Pain: mild  Post-op Assessment: Post-op Vital signs reviewed, Patient's Cardiovascular Status Stable, Respiratory Function Stable, Patent Airway, No signs of Nausea or vomiting and Pain level controlled  Post-op Vital Signs: Reviewed and stable  Complications: No apparent anesthesia complications

## 2012-10-17 NOTE — Progress Notes (Signed)
pacu nursing -  One earring given to patients family

## 2012-10-17 NOTE — H&P (Addendum)
Jon Ibarra  08/20/1990 [id number]  CARE TEAM:  PCP: No primary provider on file.  Outpatient Care Team: Patient has no care team.  Inpatient Treatment Team: Treatment Team: Attending Provider: Laray Anger, DO; Registered Nurse: Pincus Sanes, RN  This patient is a 22 y.o.male who presents today for surgical evaluation at the request of Dr. Clarene Duke.   Reason for evaluation: Appendicitis.  An otherwise healthy male who noticed abdominal pain starting yesterday.  Rather sharp.  It has become more intense.  Has not eaten today.  Felt lightheaded after urinating and passed out.  Brought to emergency room.No dysrhythmia.  Normal sinus rhythm.  CT scan of head normal.  Mental status sharp.  No alcohol intake or other drug intake.  Small laceration repaired.    Complained of persistent abdominal pain.  Tenderness concerning.  CAT scan suspicious for early appendicitis.  Surgical consultation requested.  No personal nor family history of GI/colon cancer, inflammatory bowel disease, irritable bowel syndrome, allergy such as Celiac Sprue, dietary/dairy problems, colitis, ulcers nor gastritis.  No recent sick contacts/gastroenteritis.  No travel outside the country.  No changes in diet.   No exertional chest/neck/shoulder/arm pain.  Patient can walk 60 minutes for about 2 miles without difficulty.      History reviewed. No pertinent past medical history.  History reviewed. No pertinent past surgical history.  History   Social History  . Marital Status: Single    Spouse Name: N/A    Number of Children: N/A  . Years of Education: N/A   Occupational History  . Not on file.   Social History Main Topics  . Smoking status: Never Smoker   . Smokeless tobacco: Not on file  . Alcohol Use: Yes     Comment: occ  . Drug Use: No  . Sexually Active:    Other Topics Concern  . Not on file   Social History Narrative  . No narrative on file    History reviewed. No pertinent  family history.  Current Facility-Administered Medications  Medication Dose Route Frequency Provider Last Rate Last Dose  . 0.9 %  sodium chloride infusion   Intravenous Continuous Laray Anger, DO 125 mL/hr at 10/17/12 1409     No current outpatient prescriptions on file.     No Known Allergies  ROS: Constitutional:  No fevers, chills, sweats.  Weight stable Eyes:  No vision changes, No discharge HENT:  No sore throats, nasal drainage Lymph: No neck swelling, No bruising easily Pulmonary:  No cough, productive sputum CV: No orthopnea, PND  Patient walks 60 minutes for about 2 miles without difficulty.  No exertional chest/neck/shoulder/arm pain. GI:  No personal nor family history of GI/colon cancer, inflammatory bowel disease, irritable bowel syndrome, allergy such as Celiac Sprue, dietary/dairy problems, colitis, ulcers nor gastritis.  No recent sick contacts/gastroenteritis.  No travel outside the country.  No changes in diet. Renal: No UTIs, No hematuria Genital:  No drainage, bleeding, masses Musculoskeletal: No severe joint pain.  Good ROM major joints Skin:  No sores or lesions.  No rashes Heme/Lymph:  No easy bleeding.  No swollen lymph nodes Neuro: No focal weakness/numbness.  No seizures Psych: No suicidal ideation.  No hallucinations  BP 115/67  Pulse 100  Temp(Src) 97.8 F (36.6 C) (Oral)  Resp 23  SpO2 100%  Physical Exam: General: Thin but not cachectic.  Pt awake/alert/oriented x4 in moderate acute distress Eyes: PERRL, normal EOM. Sclera nonicteric Neuro: CN II-XII intact w/o focal  sensory/motor deficits. Lymph: No head/neck/groin lymphadenopathy Psych:  No delerium/psychosis/paranoia.  Anxious but consolable HENT: Normocephalic, Mucus membranes moist.  No thrush Neck: Supple, No tracheal deviation Chest: No pain.  Good respiratory excursion. CV:  Pulses intact.  Regular rhythm Abdomen: Soft, Nondistended.  Involuntary guarding with TTP R suprapubic  area, medial to McBurney's point.  ++pain w cough & bedshake.  No incarcerated hernias. Ext:  SCDs BLE.  No significant edema.  No cyanosis Skin: No petechiae / purpurea.  No major sores Musculoskeletal: No severe joint pain.  Good ROM major joints   Results:   Labs: Results for orders placed during the hospital encounter of 10/17/12 (from the past 48 hour(s))  GLUCOSE, CAPILLARY     Status: None   Collection Time    10/17/12 12:50 PM      Result Value Range   Glucose-Capillary 86  70 - 99 mg/dL  ETHANOL     Status: None   Collection Time    10/17/12  1:20 PM      Result Value Range   Alcohol, Ethyl (B) <11  0 - 11 mg/dL   Comment:            LOWEST DETECTABLE LIMIT FOR     SERUM ALCOHOL IS 11 mg/dL     FOR MEDICAL PURPOSES ONLY  CBC WITH DIFFERENTIAL     Status: Abnormal   Collection Time    10/17/12  1:20 PM      Result Value Range   WBC 13.5 (*) 4.0 - 10.5 K/uL   RBC 4.50  4.22 - 5.81 MIL/uL   Hemoglobin 13.7  13.0 - 17.0 g/dL   HCT 40.9  81.1 - 91.4 %   MCV 88.9  78.0 - 100.0 fL   MCH 30.4  26.0 - 34.0 pg   MCHC 34.3  30.0 - 36.0 g/dL   RDW 78.2  95.6 - 21.3 %   Platelets 247  150 - 400 K/uL   Neutrophils Relative % 82 (*) 43 - 77 %   Neutro Abs 11.0 (*) 1.7 - 7.7 K/uL   Lymphocytes Relative 8 (*) 12 - 46 %   Lymphs Abs 1.1  0.7 - 4.0 K/uL   Monocytes Relative 10  3 - 12 %   Monocytes Absolute 1.4 (*) 0.1 - 1.0 K/uL   Eosinophils Relative 1  0 - 5 %   Eosinophils Absolute 0.1  0.0 - 0.7 K/uL   Basophils Relative 0  0 - 1 %   Basophils Absolute 0.0  0.0 - 0.1 K/uL  COMPREHENSIVE METABOLIC PANEL     Status: Abnormal   Collection Time    10/17/12  1:20 PM      Result Value Range   Sodium 135  135 - 145 mEq/L   Potassium 3.8  3.5 - 5.1 mEq/L   Chloride 99  96 - 112 mEq/L   CO2 28  19 - 32 mEq/L   Glucose, Bld 100 (*) 70 - 99 mg/dL   BUN 8  6 - 23 mg/dL   Creatinine, Ser 0.86  0.50 - 1.35 mg/dL   Calcium 9.8  8.4 - 57.8 mg/dL   Total Protein 8.4 (*) 6.0 - 8.3  g/dL   Albumin 4.1  3.5 - 5.2 g/dL   AST 74 (*) 0 - 37 U/L   ALT 38  0 - 53 U/L   Alkaline Phosphatase 88  39 - 117 U/L   Total Bilirubin 1.8 (*) 0.3 - 1.2 mg/dL  GFR calc non Af Amer >90  >90 mL/min   GFR calc Af Amer >90  >90 mL/min   Comment:            The eGFR has been calculated     using the CKD EPI equation.     This calculation has not been     validated in all clinical     situations.     eGFR's persistently     <90 mL/min signify     possible Chronic Kidney Disease.  LIPASE, BLOOD     Status: None   Collection Time    10/17/12  1:20 PM      Result Value Range   Lipase 22  11 - 59 U/L  TROPONIN I     Status: None   Collection Time    10/17/12  1:20 PM      Result Value Range   Troponin I <0.30  <0.30 ng/mL   Comment:            Due to the release kinetics of cTnI,     a negative result within the first hours     of the onset of symptoms does not rule out     myocardial infarction with certainty.     If myocardial infarction is still suspected,     repeat the test at appropriate intervals.  URINE RAPID DRUG SCREEN (HOSP PERFORMED)     Status: None   Collection Time    10/17/12  1:57 PM      Result Value Range   Opiates NONE DETECTED  NONE DETECTED   Cocaine NONE DETECTED  NONE DETECTED   Benzodiazepines NONE DETECTED  NONE DETECTED   Amphetamines NONE DETECTED  NONE DETECTED   Tetrahydrocannabinol NONE DETECTED  NONE DETECTED   Barbiturates NONE DETECTED  NONE DETECTED   Comment:            DRUG SCREEN FOR MEDICAL PURPOSES     ONLY.  IF CONFIRMATION IS NEEDED     FOR ANY PURPOSE, NOTIFY LAB     WITHIN 5 DAYS.                LOWEST DETECTABLE LIMITS     FOR URINE DRUG SCREEN     Drug Class       Cutoff (ng/mL)     Amphetamine      1000     Barbiturate      200     Benzodiazepine   200     Tricyclics       300     Opiates          300     Cocaine          300     THC              50  URINALYSIS W MICROSCOPIC + REFLEX CULTURE     Status: None    Collection Time    10/17/12  1:57 PM      Result Value Range   Color, Urine YELLOW  YELLOW   APPearance CLEAR  CLEAR   Specific Gravity, Urine 1.014  1.005 - 1.030   pH 7.5  5.0 - 8.0   Glucose, UA NEGATIVE  NEGATIVE mg/dL   Hgb urine dipstick NEGATIVE  NEGATIVE   Bilirubin Urine NEGATIVE  NEGATIVE   Ketones, ur NEGATIVE  NEGATIVE mg/dL   Protein, ur NEGATIVE  NEGATIVE mg/dL   Urobilinogen, UA 1.0  0.0 - 1.0 mg/dL   Nitrite NEGATIVE  NEGATIVE   Leukocytes, UA NEGATIVE  NEGATIVE   WBC, UA 0-2  <3 WBC/hpf   RBC / HPF 0-2  <3 RBC/hpf    Imaging / Studies: Dg Chest 2 View  10/17/2012   *RADIOLOGY REPORT*  Clinical Data: loss of consciousness  CHEST - 2 VIEW  Comparison: None.  Findings: The heart size and mediastinal contours are within normal limits.  Both lungs are clear.  The visualized skeletal structures are unremarkable.  IMPRESSION: Negative exam.   Original Report Authenticated By: Signa Kell, M.D.   Ct Head Wo Contrast  10/17/2012   *RADIOLOGY REPORT*  Clinical Data: Abdominal pain, syncopal episode  CT HEAD WITHOUT CONTRAST  Technique:  Contiguous axial images were obtained from the base of the skull through the vertex without contrast.  Comparison: Prior CT scan of the head 07/18/2012  Findings: No acute intracranial hemorrhage, acute infarction, mass lesion, mass effect, midline shift or hydrocephalus.  Gray-white differentiation is preserved throughout.  Normal aeration of the mastoid air cells and visualized paranasal sinuses.  No focal soft tissue or calvarial abnormality.  IMPRESSION: Negative noncontrasted head CT.   Original Report Authenticated By: Malachy Moan, M.D.   Ct Abdomen Pelvis W Contrast  10/17/2012   *RADIOLOGY REPORT*  Clinical Data: Abdominal pain and syncope.  CT ABDOMEN AND PELVIS WITH CONTRAST  Technique:  Multidetector CT imaging of the abdomen and pelvis was performed following the standard protocol during bolus administration of intravenous contrast.   Contrast: OMNIPAQUE IOHEXOL 300 MG/ML  SOLN  Comparison: None.  Findings: The appendix is distended to a diameter of 11 mm and shows intense enhancement.  There is no periappendiceal inflammation.  There is a small amount of free fluid in the pelvic cul-de-sac.  The liver, biliary tree, spleen, pancreas, adrenal glands, and kidneys are normal.  No dilated loops of large or small bowel.  IMPRESSION: Early acute appendicitis.  Free fluid in the pelvis.   Original Report Authenticated By: Francene Boyers, M.D.    Medications / Allergies: per chart  Antibiotics: Anti-infectives   None      Assessment  Jon Ibarra  22 y.o. male       Problem List:  Active Problems:   Acute appendicitis   Acute appendicitis with peritonitis.  No evidence of significant perforation or abscess by CT scan  Plan:  IV antibiotics - zosyn already started.  Rocef/MTZ prob OK post-op  Control pain and nausea.  IV fluids.  Laparoscopic exploration with appendectomy:  The anatomy & physiology of the digestive tract was discussed.  The pathophysiology of appendicitis was discussed.  Natural history risks without surgery was discussed.   I feel the risks of no intervention will lead to serious problems that outweigh the operative risks; therefore, I recommended diagnostic laparoscopy with removal of appendix to remove the pathology.  Laparoscopic & open techniques were discussed.   I noted a good likelihood this will help address the problem.    Risks such as bleeding, infection, abscess, leak, reoperation, possible ostomy, hernia, heart attack, death, and other risks were discussed.  Goals of post-operative recovery were discussed as well.  We will work to minimize complications.  Questions were answered.  The patient expresses understanding & wishes to proceed with surgery.   -VTE prophylaxis- SCDs, etc  -mobilize as tolerated to help recovery    Ardeth Sportsman, M.D., F.A.C.S. Gastrointestinal  and Minimally Invasive Surgery [facility] [phone]  [address] [place], [place] [address] [phone] Main / Paging   10/17/2012

## 2012-10-18 ENCOUNTER — Encounter (HOSPITAL_COMMUNITY): Payer: Self-pay | Admitting: Surgery

## 2012-10-18 LAB — BASIC METABOLIC PANEL
BUN: 5 mg/dL — ABNORMAL LOW (ref 6–23)
CO2: 30 mEq/L (ref 19–32)
Chloride: 100 mEq/L (ref 96–112)
Glucose, Bld: 129 mg/dL — ABNORMAL HIGH (ref 70–99)
Potassium: 3.3 mEq/L — ABNORMAL LOW (ref 3.5–5.1)

## 2012-10-18 LAB — CBC
HCT: 35.6 % — ABNORMAL LOW (ref 39.0–52.0)
Hemoglobin: 12.1 g/dL — ABNORMAL LOW (ref 13.0–17.0)
MCH: 30.2 pg (ref 26.0–34.0)
MCHC: 34 g/dL (ref 30.0–36.0)
MCV: 88.8 fL (ref 78.0–100.0)

## 2012-10-18 MED ORDER — OXYCODONE HCL 5 MG PO TABS: 5.0000 mg | ORAL_TABLET | ORAL | Status: DC | PRN

## 2012-10-18 MED ORDER — POTASSIUM CHLORIDE CRYS ER 20 MEQ PO TBCR
30.0000 meq | EXTENDED_RELEASE_TABLET | Freq: Once | ORAL | Status: AC
  Administered 2012-10-18: 30 meq via ORAL
  Filled 2012-10-18: qty 1

## 2012-10-18 MED ORDER — IBUPROFEN 600 MG PO TABS
600.0000 mg | ORAL_TABLET | Freq: Four times a day (QID) | ORAL | Status: DC | PRN
  Filled 2012-10-18: qty 1

## 2012-10-18 MED ORDER — ACETAMINOPHEN 325 MG PO TABS: 650.0000 mg | ORAL_TABLET | Freq: Four times a day (QID) | ORAL | Status: DC | PRN

## 2012-10-18 MED ORDER — ACETAMINOPHEN 325 MG PO TABS
650.0000 mg | ORAL_TABLET | Freq: Four times a day (QID) | ORAL | Status: DC | PRN
  Filled 2012-10-18: qty 2

## 2012-10-18 NOTE — ED Provider Notes (Signed)
Medical procedure only (laceration repair) was performed by the midlevel provider.  I personally evaluated the patient during the encounter. Please see my previous note.   Laray Anger, DO 10/18/12 Darliss Ridgel

## 2012-10-18 NOTE — Progress Notes (Signed)
1 Day Post-Op  Subjective: Still sore he has only been up to BR.  Took some clears for breakfast.  Objective: Vital signs in last 24 hours: Temp:  [97.5 F (36.4 C)-98.6 F (37 C)] 98.1 F (36.7 C) (07/07 0500) Pulse Rate:  [68-100] 68 (07/07 0500) Resp:  [16-29] 16 (07/07 0500) BP: (111-137)/(36-87) 130/75 mmHg (07/07 0500) SpO2:  [100 %] 100 % (07/07 0500) Weight:  [56.7 kg (125 lb)] 56.7 kg (125 lb) (07/06 1959) Last BM Date: 10/16/12 Afebrile, VSS, Full liquid, to D III at lunch WBC is down, so is K+ (3.3) Intake/Output from previous day: 07/06 0701 - 07/07 0700 In: 3654.2 [I.V.:3454.2; IV Piggyback:200] Out: 515 [Urine:500; Blood:15] Intake/Output this shift:    General appearance: alert, cooperative and no distress GI: soft, tender/sore, few hypoactive BS.  incision is dry, dressing is clean.  Lab Results:   Recent Labs  10/17/12 1320 10/18/12 0413  WBC 13.5* 10.5  HGB 13.7 12.1*  HCT 40.0 35.6*  PLT 247 212    BMET  Recent Labs  10/17/12 1320 10/18/12 0413  NA 135 136  K 3.8 3.3*  CL 99 100  CO2 28 30  GLUCOSE 100* 129*  BUN 8 5*  CREATININE 0.89 0.94  CALCIUM 9.8 9.0   PT/INR No results found for this basename: LABPROT, INR,  in the last 72 hours   Recent Labs Lab 10/17/12 1320  AST 74*  ALT 38  ALKPHOS 88  BILITOT 1.8*  PROT 8.4*  ALBUMIN 4.1     Lipase     Component Value Date/Time   LIPASE 22 10/17/2012 1320     Studies/Results: Dg Chest 2 View  10/17/2012   *RADIOLOGY REPORT*  Clinical Data: loss of consciousness  CHEST - 2 VIEW  Comparison: None.  Findings: The heart size and mediastinal contours are within normal limits.  Both lungs are clear.  The visualized skeletal structures are unremarkable.  IMPRESSION: Negative exam.   Original Report Authenticated By: Signa Kell, M.D.   Ct Head Wo Contrast  10/17/2012   *RADIOLOGY REPORT*  Clinical Data: Abdominal pain, syncopal episode  CT HEAD WITHOUT CONTRAST  Technique:   Contiguous axial images were obtained from the base of the skull through the vertex without contrast.  Comparison: Prior CT scan of the head 07/18/2012  Findings: No acute intracranial hemorrhage, acute infarction, mass lesion, mass effect, midline shift or hydrocephalus.  Gray-white differentiation is preserved throughout.  Normal aeration of the mastoid air cells and visualized paranasal sinuses.  No focal soft tissue or calvarial abnormality.  IMPRESSION: Negative noncontrasted head CT.   Original Report Authenticated By: Malachy Moan, M.D.   Ct Abdomen Pelvis W Contrast  10/17/2012   *RADIOLOGY REPORT*  Clinical Data: Abdominal pain and syncope.  CT ABDOMEN AND PELVIS WITH CONTRAST  Technique:  Multidetector CT imaging of the abdomen and pelvis was performed following the standard protocol during bolus administration of intravenous contrast.  Contrast: OMNIPAQUE IOHEXOL 300 MG/ML  SOLN  Comparison: None.  Findings: The appendix is distended to a diameter of 11 mm and shows intense enhancement.  There is no periappendiceal inflammation.  There is a small amount of free fluid in the pelvic cul-de-sac.  The liver, biliary tree, spleen, pancreas, adrenal glands, and kidneys are normal.  No dilated loops of large or small bowel.  IMPRESSION: Early acute appendicitis.  Free fluid in the pelvis.   Original Report Authenticated By: Francene Boyers, M.D.    Medications: . acetaminophen  1,000 mg Oral TID  . cefTRIAXone (ROCEPHIN)  IV  2 g Intravenous Q24H  . heparin subcutaneous  5,000 Units Subcutaneous Q8H  . lip balm  1 application Topical BID  . metronidazole  500 mg Intravenous Q8H  . sodium chloride  3 mL Intravenous Q12H    Assessment/Plan Acute appendicitis;  S/p APPENDECTOMY LAPAROSCOPIC, 10/17/2012, Ardeth Sportsman, MD  PLan:  Mobilize and advance his diet, I will give him some K+.  If he is doing well he should be able to go home later today.  He is not employed currently.        LOS: 1 day    Nabilah Davoli 10/18/2012

## 2012-10-19 NOTE — Discharge Summary (Signed)
Physician Discharge Summary  Patient ID: Jon Ibarra MRN: [id number] DOB/AGE: 04-Feb-1991 22 y.o.  Admit date: 10/17/2012 Discharge date: 10/19/2012  Admission Diagnoses: acute appendicitis   Discharge Diagnoses: Same Active Problems:   Acute appendicitis   PROCEDURES: APPENDECTOMY LAPAROSCOPIC, 10/17/2012 6:17 PM, Ardeth Sportsman, MD       Hospital Course: An otherwise healthy male who noticed abdominal pain starting yesterday. Rather sharp. It has become more intense. Has not eaten today. Felt lightheaded after urinating and passed out. Brought to emergency room.No dysrhythmia. Normal sinus rhythm. CT scan of head normal. Mental status sharp. No alcohol intake or other drug intake. Small laceration repaired.  Complained of persistent abdominal pain. Tenderness concerning. CAT scan suspicious for early appendicitis. Surgical consultation requested. No personal nor family history of GI/colon cancer, inflammatory bowel disease, irritable bowel syndrome, allergy such as Celiac Sprue, dietary/dairy problems, colitis, ulcers nor gastritis. No recent sick contacts/gastroenteritis. No travel outside the country. No changes in diet. No exertional chest/neck/shoulder/arm pain. Patient can walk 60 minutes for about 2 miles without difficulty.  Marland Kitchen He was seen and evaluated by DR. Gross and taken to the OR for surgery.  He was found to have appedicitis and underwent surgery as noted above.  He did well post op, his diet was advacned, he was mobilized and ready for discharge the following afternoon.  Site looked fine.  He will return to clinic for follow up in 2 weeks.  Condition on d/c:  Improved.    Disposition: 01-Home or Self Care   Future Appointments Provider Department Dept Phone   11/09/2012 11:15 AM [facility], [place] [phone]       Medication List         acetaminophen 325 MG tablet  Commonly known as:  TYLENOL  Take 2 tablets (650 mg  total) by mouth every 6 (six) hours as needed.     oxyCODONE 5 MG immediate release tablet  Commonly known as:  Oxy IR/ROXICODONE  Take 1-2 tablets (5-10 mg total) by mouth every 4 (four) hours as needed for pain.     oxyCODONE 5 MG immediate release tablet  Commonly known as:  Oxy IR/ROXICODONE  Take 1-2 tablets (5-10 mg total) by mouth every 4 (four) hours as needed.           Follow-up Information   Follow up with [facility]. Schedule an appointment as soon as possible for a visit on 11/09/2012. (Your appointment is 11:15.  Be there 30 minutes early for check-in.)    Contact information:   [address]   [place] [address] [phone]       Signed: Sherrie George 10/19/2012, 6:57 AM

## 2012-11-09 ENCOUNTER — Encounter (INDEPENDENT_AMBULATORY_CARE_PROVIDER_SITE_OTHER): Payer: Self-pay

## 2012-11-09 ENCOUNTER — Ambulatory Visit (INDEPENDENT_AMBULATORY_CARE_PROVIDER_SITE_OTHER): Payer: Medicaid Other | Admitting: Internal Medicine

## 2012-11-09 VITALS — BP 108/66 | HR 64 | Temp 98.0°F | Resp 16 | Ht 67.0 in | Wt 127.8 lb

## 2012-11-09 DIAGNOSIS — K3580 Unspecified acute appendicitis: Secondary | ICD-10-CM

## 2012-11-09 NOTE — Patient Instructions (Signed)
May resume regular activity without restrictions. Follow up as needed. Call with questions or concerns.  

## 2012-11-09 NOTE — Progress Notes (Signed)
  Subjective: Pt returns to the clinic today after undergoing laparoscopic appendectomy on 10/17/12 by Dr. Michaell Cowing.  The patient is tolerating their diet well and is having no severe pain.  Bowel function is good.  No problems with the wounds.  Objective: Vital signs in last 24 hours: Reviewed  PE: Abd: soft, non-tender, +bs, incisions well healed  Lab Results:  No results found for this basename: WBC, HGB, HCT, PLT,  in the last 72 hours BMET No results found for this basename: NA, K, CL, CO2, GLUCOSE, BUN, CREATININE, CALCIUM,  in the last 72 hours PT/INR No results found for this basename: LABPROT, INR,  in the last 72 hours CMP     Component Value Date/Time   NA 136 10/18/2012 0413   K 3.3* 10/18/2012 0413   CL 100 10/18/2012 0413   CO2 30 10/18/2012 0413   GLUCOSE 129* 10/18/2012 0413   BUN 5* 10/18/2012 0413   CREATININE 0.94 10/18/2012 0413   CALCIUM 9.0 10/18/2012 0413   PROT 8.4* 10/17/2012 1320   ALBUMIN 4.1 10/17/2012 1320   AST 74* 10/17/2012 1320   ALT 38 10/17/2012 1320   ALKPHOS 88 10/17/2012 1320   BILITOT 1.8* 10/17/2012 1320   GFRNONAA >90 10/18/2012 0413   GFRAA >90 10/18/2012 0413   Lipase     Component Value Date/Time   LIPASE 22 10/17/2012 1320       Studies/Results: No results found.  Anti-infectives: Anti-infectives   None       Assessment/Plan  1.  S/P Laparoscopic Appendectomy: doing well, may resume regular activity without restrictions, Pt will follow up with [place] PRN and knows to call with questions or concerns.     Jon Ibarra 11/09/2012

## 2013-04-24 ENCOUNTER — Encounter (HOSPITAL_COMMUNITY): Payer: Self-pay | Admitting: Emergency Medicine

## 2013-04-24 ENCOUNTER — Emergency Department (HOSPITAL_COMMUNITY)
Admission: EM | Admit: 2013-04-24 | Discharge: 2013-04-25 | Disposition: A | Discharge status: Home / Self Care | Payer: Medicaid Other | Attending: Emergency Medicine | Admitting: Emergency Medicine

## 2013-04-24 DIAGNOSIS — R42 Dizziness and giddiness: Secondary | ICD-10-CM | POA: Insufficient documentation

## 2013-04-24 DIAGNOSIS — E162 Hypoglycemia, unspecified: Secondary | ICD-10-CM

## 2013-04-24 DIAGNOSIS — R5381 Other malaise: Secondary | ICD-10-CM | POA: Insufficient documentation

## 2013-04-24 DIAGNOSIS — R5383 Other fatigue: Secondary | ICD-10-CM

## 2013-04-24 LAB — CBC WITH DIFFERENTIAL/PLATELET
BASOS ABS: 0.1 10*3/uL (ref 0.0–0.1)
Basophils Relative: 1 % (ref 0–1)
EOS ABS: 0.2 10*3/uL (ref 0.0–0.7)
EOS PCT: 3 % (ref 0–5)
HEMATOCRIT: 41.2 % (ref 39.0–52.0)
Hemoglobin: 14.3 g/dL (ref 13.0–17.0)
LYMPHS ABS: 1.4 10*3/uL (ref 0.7–4.0)
LYMPHS PCT: 19 % (ref 12–46)
MCH: 31.2 pg (ref 26.0–34.0)
MCHC: 34.7 g/dL (ref 30.0–36.0)
MCV: 89.8 fL (ref 78.0–100.0)
MONO ABS: 0.8 10*3/uL (ref 0.1–1.0)
Monocytes Relative: 10 % (ref 3–12)
Neutro Abs: 4.9 10*3/uL (ref 1.7–7.7)
Neutrophils Relative %: 67 % (ref 43–77)
PLATELETS: 278 10*3/uL (ref 150–400)
RBC: 4.59 MIL/uL (ref 4.22–5.81)
RDW: 11.9 % (ref 11.5–15.5)
WBC: 7.4 10*3/uL (ref 4.0–10.5)

## 2013-04-24 LAB — BASIC METABOLIC PANEL
BUN: 10 mg/dL (ref 6–23)
CALCIUM: 9.7 mg/dL (ref 8.4–10.5)
CO2: 22 meq/L (ref 19–32)
CREATININE: 1.08 mg/dL (ref 0.50–1.35)
Chloride: 94 mEq/L — ABNORMAL LOW (ref 96–112)
GFR calc Af Amer: >90 mL/min (ref >90)
GLUCOSE: 160 mg/dL — AB (ref 70–99)
Potassium: 3.8 mEq/L (ref 3.7–5.3)
Sodium: 133 mEq/L — ABNORMAL LOW (ref 137–147)

## 2013-04-24 LAB — GLUCOSE, CAPILLARY
GLUCOSE-CAPILLARY: 67 mg/dL — AB (ref 70–99)
Glucose-Capillary: 88 mg/dL (ref 70–99)

## 2013-04-24 NOTE — Discharge Instructions (Signed)

## 2013-04-24 NOTE — ED Provider Notes (Signed)
CSN: [id number]     Arrival date & time 04/24/13  2202 History   First MD Initiated Contact with Patient 04/24/13 2205     Chief Complaint  Patient presents with  . Seizures   (Consider location/radiation/quality/duration/timing/severity/associated sxs/prior Treatment) HPI  This a 22 are old who presents following an episode. Per the patient's friend, they were working out earlier this evening. After getting off a squat machine, the patient became cold and clammy. He seemed to "go out of it."  There is no notable shaking. After a minute or 2, the patient began to come around and be more responsive. He has a reported history of similar episodes in the past; however, the patient's mother reports no known history of seizures. Patient's initial glucose was 67. Patient was given a couple with complete improvement of symptoms. He states that he has had episodes in the past but mostly in the setting of working out and not having he eaten.  Patient reports mild headache but otherwise has no physical complaints. He has had no recent illnesses.  History reviewed. No pertinent past medical history. Past Surgical History  Procedure Laterality Date  . Laparoscopic appendectomy N/A 10/17/2012    Procedure: APPENDECTOMY LAPAROSCOPIC;  Surgeon: Ardeth Sportsman, MD;  Location: [facility];  Service: General;  Laterality: N/A;   No family history on file. History  Substance Use Topics  . Smoking status: Never Smoker   . Smokeless tobacco: Never Used  . Alcohol Use: Yes     Comment: occ    Review of Systems  Constitutional: Negative for fever.  Respiratory: Negative.  Negative for chest tightness and shortness of breath.   Cardiovascular: Negative.  Negative for chest pain.  Gastrointestinal: Negative.  Negative for abdominal pain.  Genitourinary: Negative.  Negative for dysuria.  Musculoskeletal: Negative for back pain.  Skin: Negative for rash.  Neurological: Positive for weakness and light-headedness.  Negative for syncope and headaches.  All other systems reviewed and are negative.    Allergies  Review of patient's allergies indicates no known allergies.  Home Medications   No current outpatient prescriptions on file. BP 121/56  Pulse 84  Temp(Src) 97.9 F (36.6 C) (Oral)  Resp 18  Ht 5\' 8"  (1.727 m)  Wt 130 lb (58.968 kg)  BMI 19.77 kg/m2  SpO2 100% Physical Exam  Nursing note and vitals reviewed. Constitutional: He is oriented to person, place, and time.  Drowsy but oriented  HENT:  Head: Normocephalic and atraumatic.  Eyes: Pupils are equal, round, and reactive to light.  Neck: Neck supple.  Cardiovascular: Normal rate, regular rhythm and normal heart sounds.   No murmur heard. Pulmonary/Chest: Effort normal and breath sounds normal. No respiratory distress. He has no wheezes.  Abdominal: Soft. Bowel sounds are normal. There is no tenderness. There is no rebound.  Musculoskeletal: He exhibits no edema.  Lymphadenopathy:    He has no cervical adenopathy.  Neurological: He is alert and oriented to person, place, and time. No cranial nerve deficit. Coordination normal.  5 out of 5 strength in all 4 extremities  Skin: Skin is warm and dry.  Psychiatric: He has a normal mood and affect.    ED Course  Procedures (including critical care time) Labs Review Labs Reviewed  GLUCOSE, CAPILLARY - Abnormal; Notable for the following:    Glucose-Capillary 67 (*)    All other components within normal limits  BASIC METABOLIC PANEL - Abnormal; Notable for the following:    Sodium 133 (*)  Chloride 94 (*)    Glucose, Bld 160 (*)    All other components within normal limits  CBC WITH DIFFERENTIAL  GLUCOSE, CAPILLARY   Imaging Review No results found.  EKG Interpretation    Date/Time:  Sunday April 24 2013 22:10:29 EST Ventricular Rate:  87 PR Interval:  145 QRS Duration: 86 QT Interval:  365 QTC Calculation: 439 R Axis:   80 Text Interpretation:  Sinus rhythm  Right atrial enlargement RSR' in V1 or V2, probably normal variant ST elev, probable normal early repol pattern No significant change since last tracing Confirmed by HORTON  MD, COURTNEY ([id number]) on 04/24/2013 10:37:02 PM            MDM   1. Hypoglycemia     Patient presents with episodes following lifting weights. He endorses not having eaten well prior to lifting weights. Per witnesses, patient became cold and clammy but there is no evidence of tonic-clonic activity. Patient's blood glucose noted to be 67. Patient reports improvement of symptoms after he was given food. At this time I have low suspicion for seizure and feel patient's symptoms may be secondary to hypoglycemia. Basic labwork obtained. Patient given by mouth challenge.  After history, exam, and medical workup I feel the patient has been appropriately medically screened and is safe for discharge home. Pertinent diagnoses were discussed with the patient. Patient was given return precautions.     Shon Batonourtney F Horton, MD 04/24/13 [phone]

## 2013-04-24 NOTE — ED Notes (Addendum)
CBG = 67. Pt given coke to drink with peanut butter and crackers.

## 2013-04-24 NOTE — ED Notes (Signed)
Per pt's friend, they were at the gym working out when he became calmmy and sweaty and began having what looked like a seizure. Pt has hx of seizures.

## 2014-02-18 IMAGING — CR DG CHEST 2V
3 series · 3 of 3 positions shown · non-contrast
Comparison: None.

CLINICAL DATA: loss of consciousness

CHEST - 2 VIEW

[w chest lat]
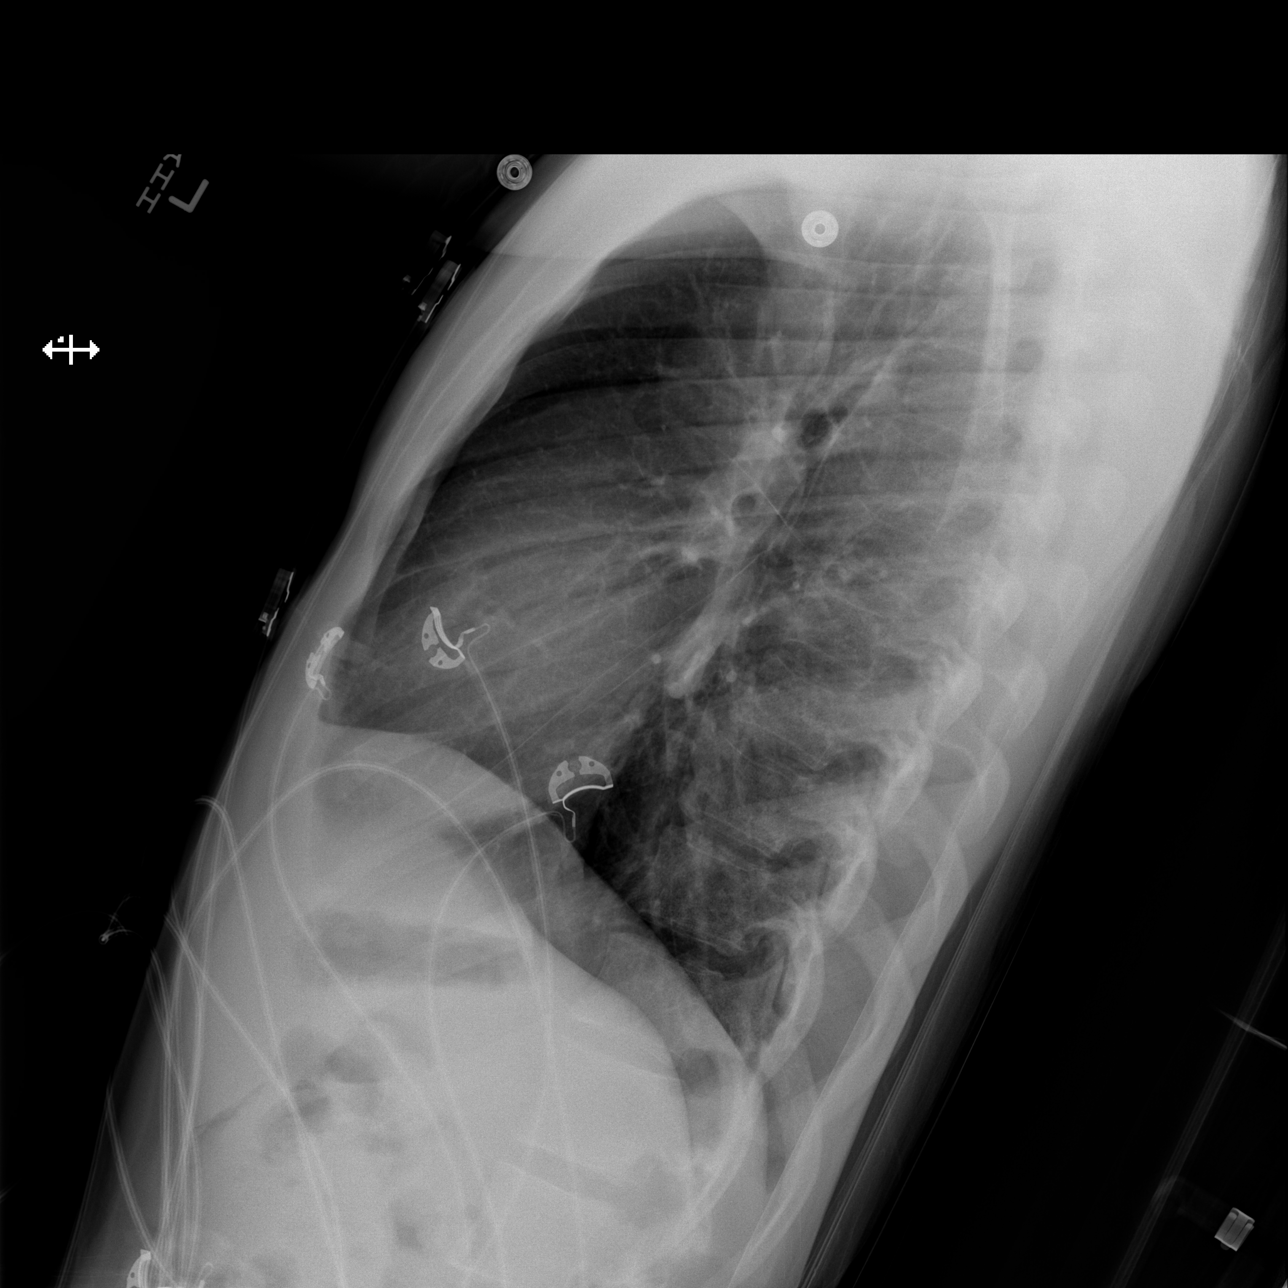

[x chest ap (1 of 2)]
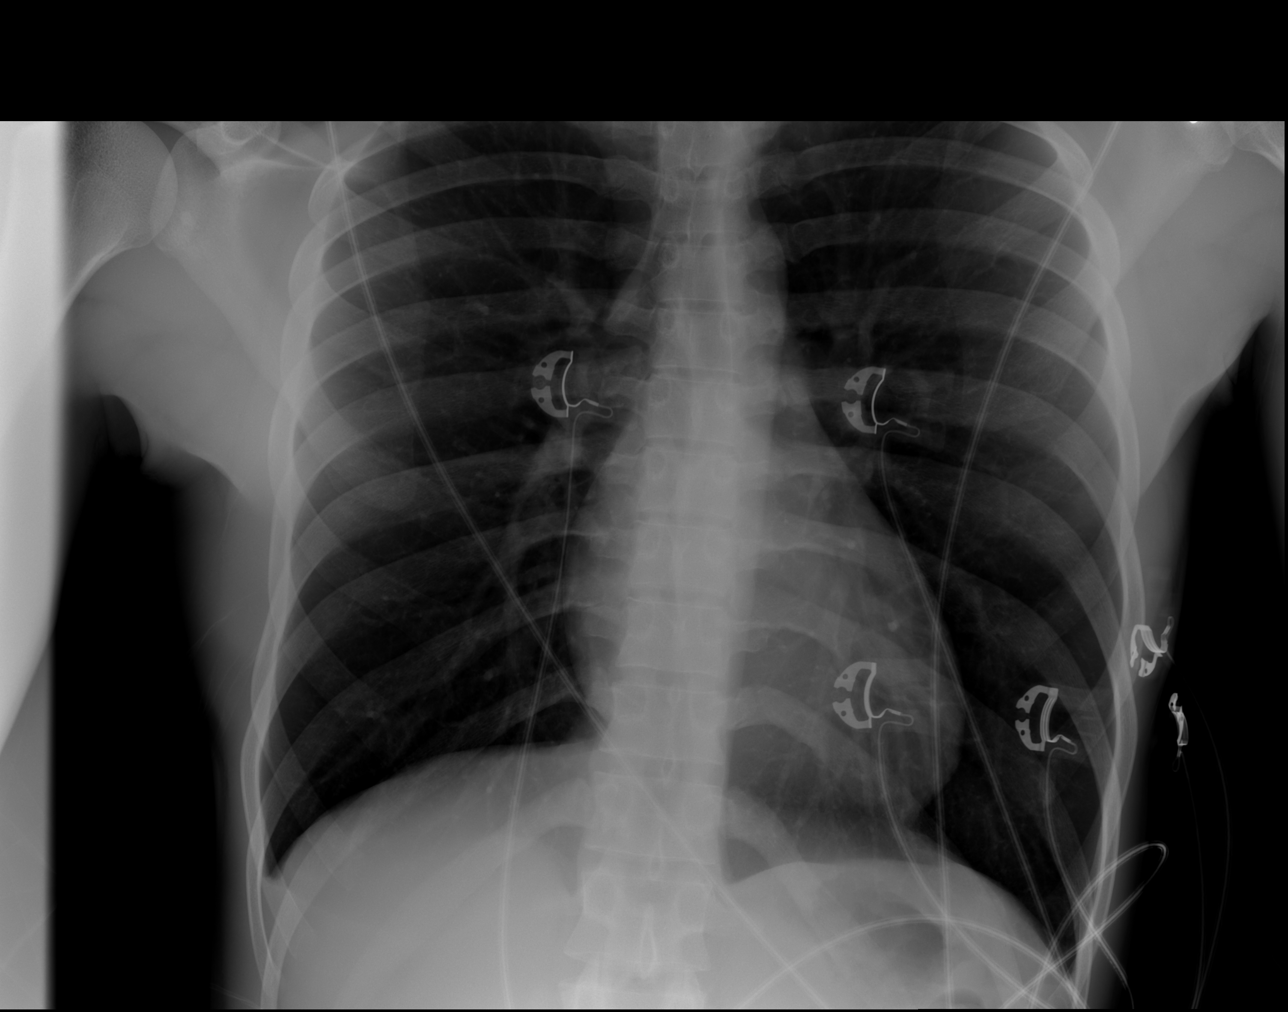

[x chest ap (2 of 2)]
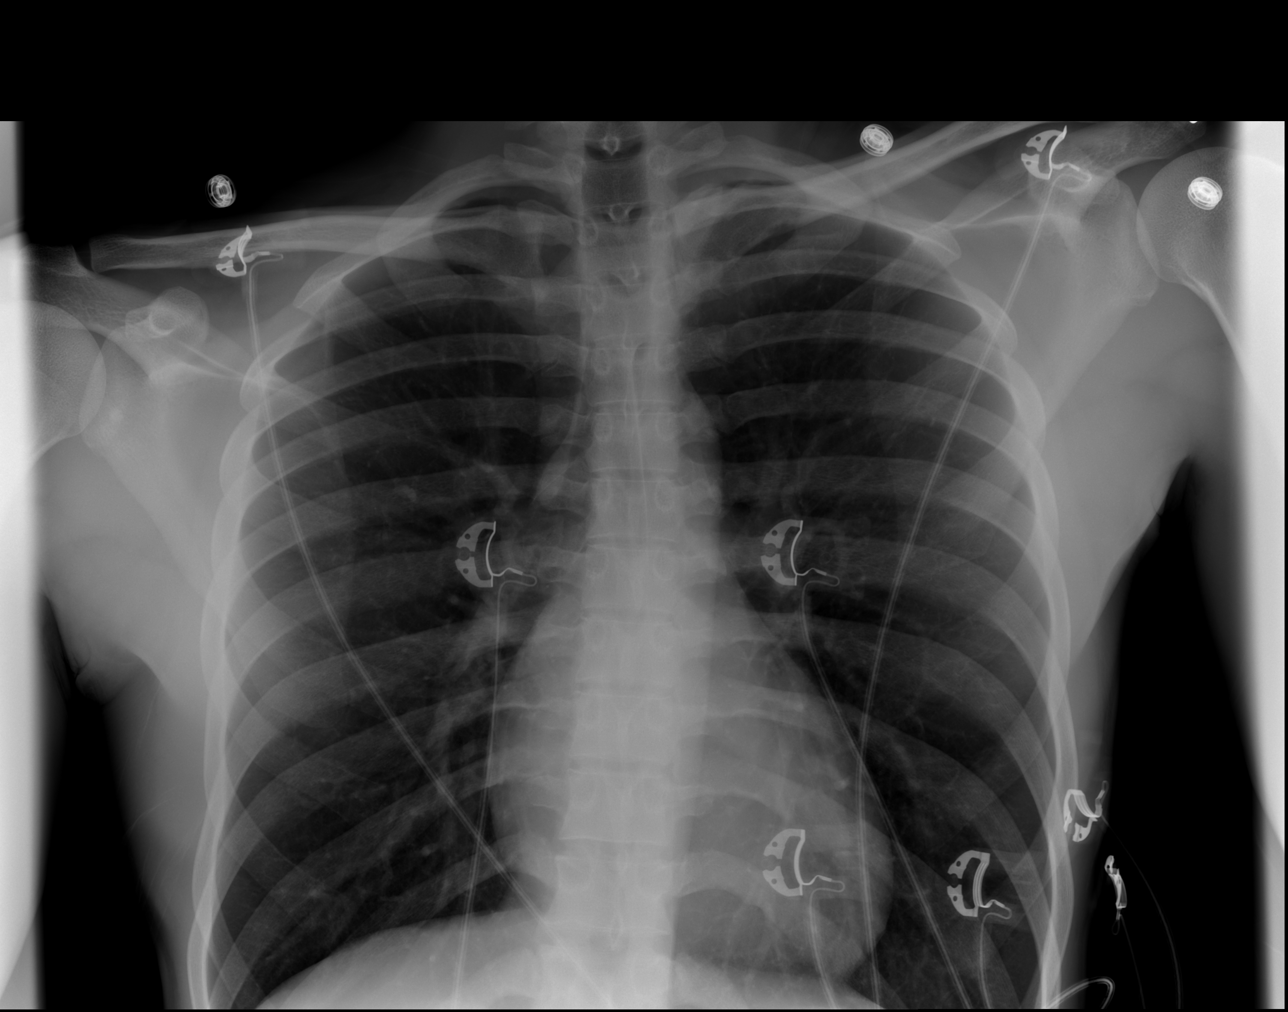

[3 of 3 positions shown; findings below may reference images not displayed]

FINDINGS: The heart size and mediastinal contours are within normal
limits.  Both lungs are clear.  The visualized skeletal structures
are unremarkable.
IMPRESSION: Negative exam.

## 2015-12-25 ENCOUNTER — Other Ambulatory Visit (HOSPITAL_COMMUNITY): Payer: Self-pay | Admitting: Ophthalmology

## 2015-12-25 ENCOUNTER — Ambulatory Visit (HOSPITAL_COMMUNITY)
Admission: RE | Admit: 2015-12-25 | Discharge: 2015-12-25 | Disposition: A | Discharge status: Home / Self Care | Payer: Medicaid Other | Source: Ambulatory Visit | Attending: Ophthalmology | Admitting: Ophthalmology

## 2015-12-25 DIAGNOSIS — H209 Unspecified iridocyclitis: Secondary | ICD-10-CM

## 2016-09-29 ENCOUNTER — Telehealth: Payer: Self-pay | Admitting: Rheumatology

## 2016-09-29 NOTE — Telephone Encounter (Signed)
Jon Ibarra from [facility] called to follow up on a referral that was sent over about a week ago.  CB#[phone].  Thank you.

## 2016-09-29 NOTE — Telephone Encounter (Signed)
Received and under review

## 2016-09-30 NOTE — Telephone Encounter (Signed)
Called Jon Ibarra at [facility] to advise her that we have received the referral and that it is under review.

## 2016-10-08 ENCOUNTER — Telehealth: Payer: Self-pay | Admitting: Rheumatology

## 2016-10-08 NOTE — Telephone Encounter (Signed)
Vikki PortsValerie would left a message inquiring about the status of the referral for this patient.  CB#[phone].  Thank you.

## 2016-10-09 NOTE — Telephone Encounter (Signed)
Records received, referral in review.

## 2017-04-27 IMAGING — CR DG CHEST 2V
2 series · 3 of 3 positions shown · non-contrast
Comparison: Chest x-ray of October 17, 2012

CLINICAL DATA: Uveitis, unexplained midchest pain occasionally over
the past 2 years, nonsmoker.

EXAM:
CHEST  2 VIEW

[chest pa]
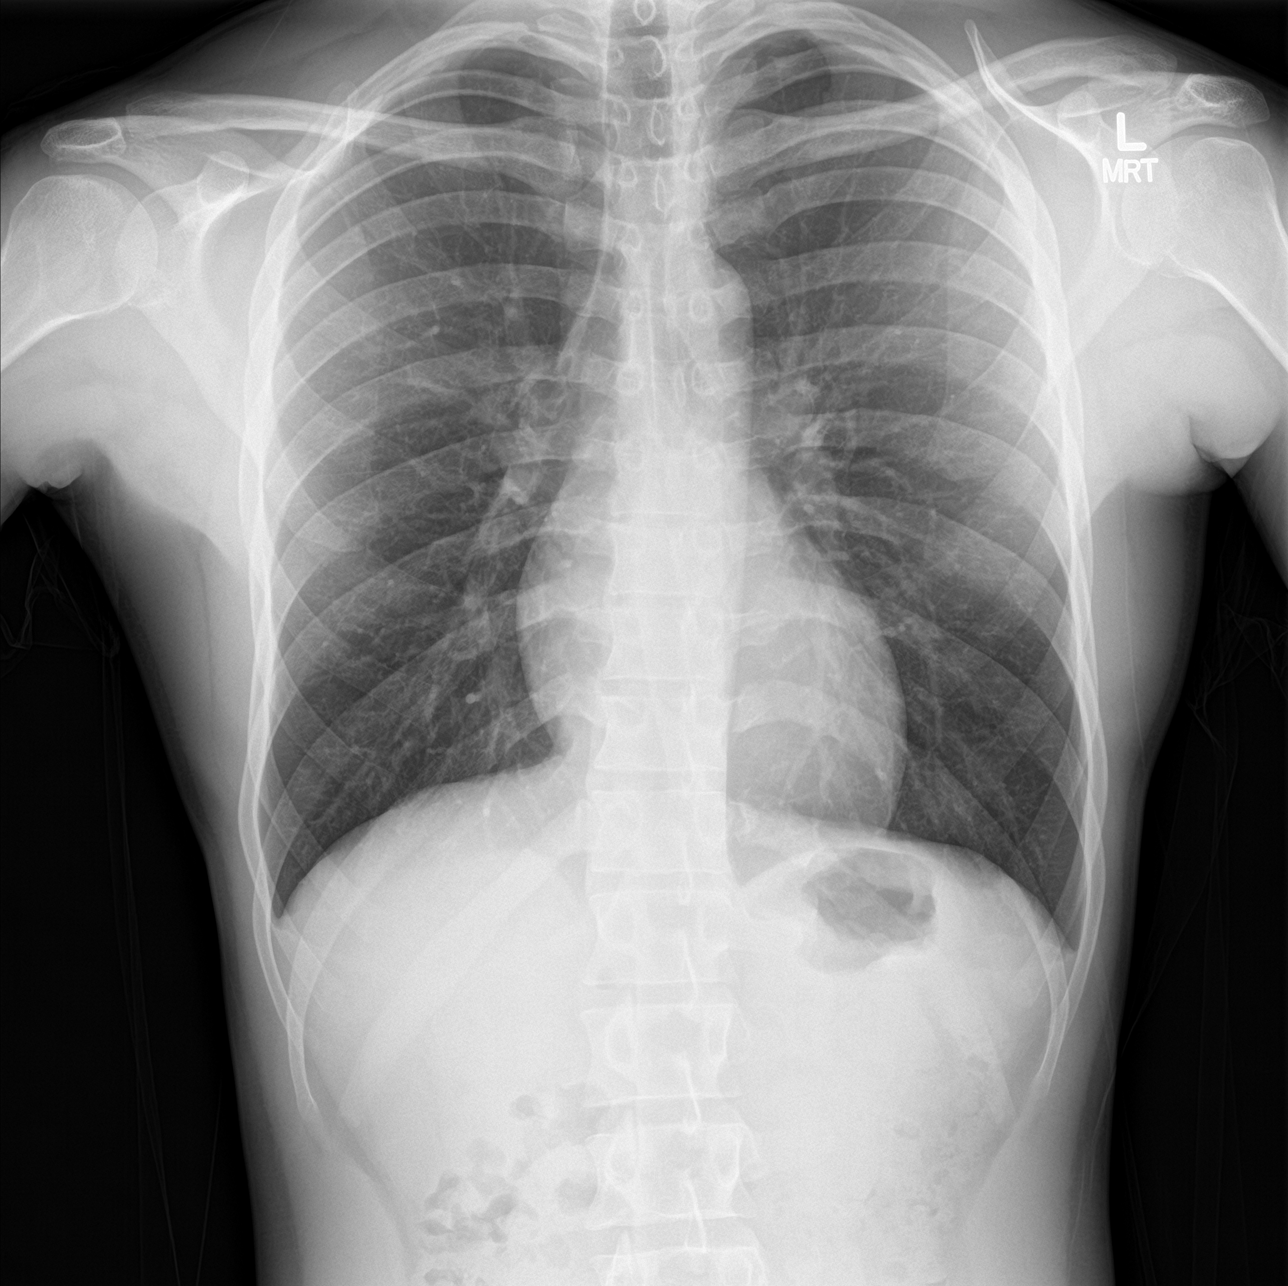

[Series 2: chest lat · 0.14mm/px · 2 of 2 slices shown]
[im 1/2]
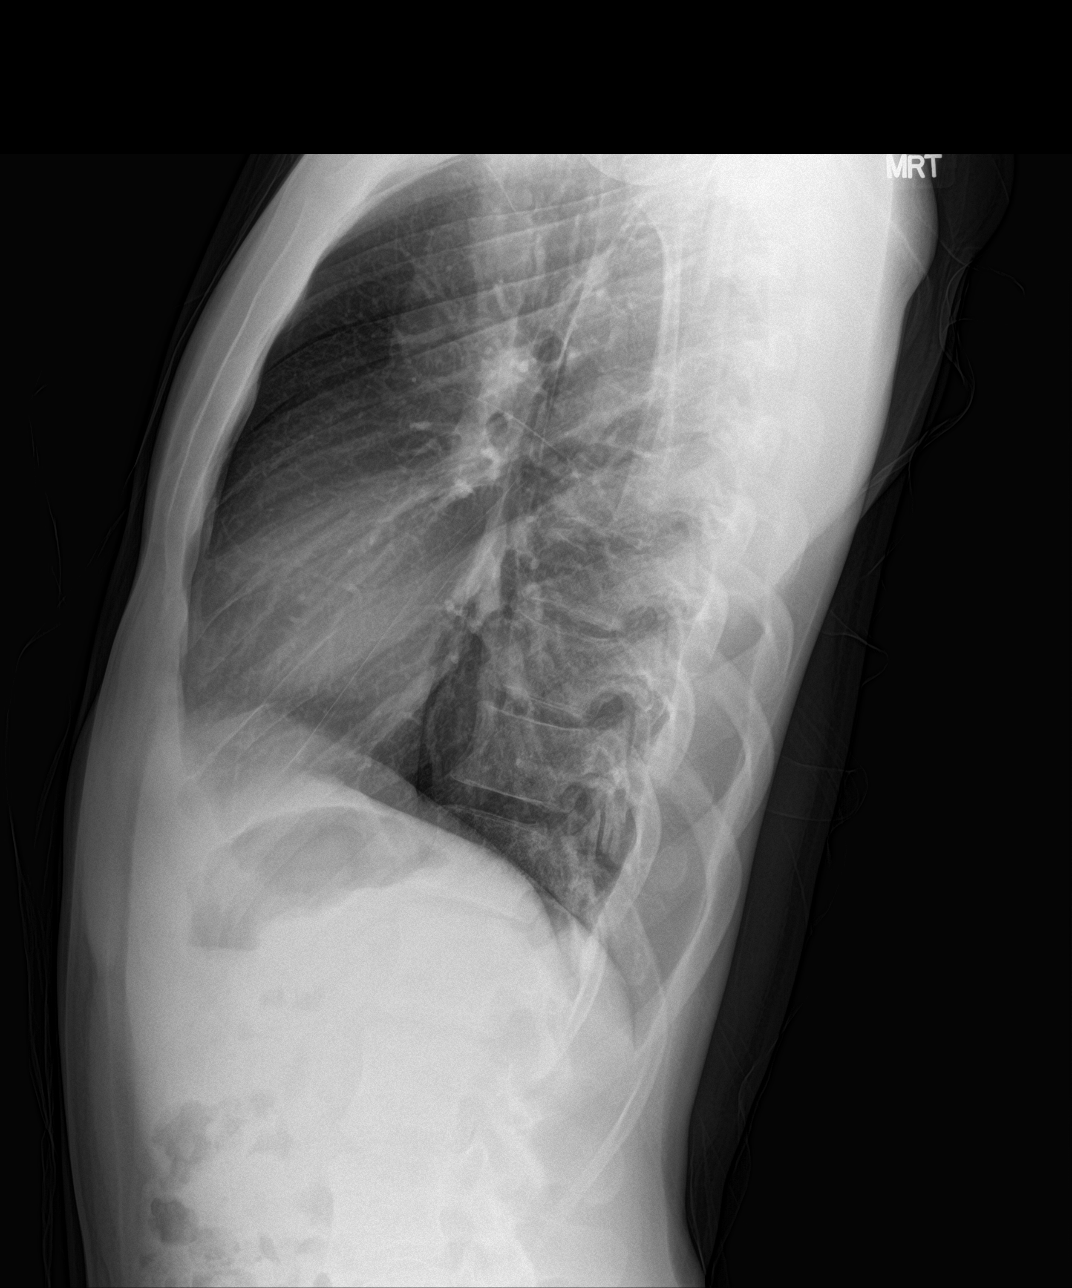
[im 2/2]
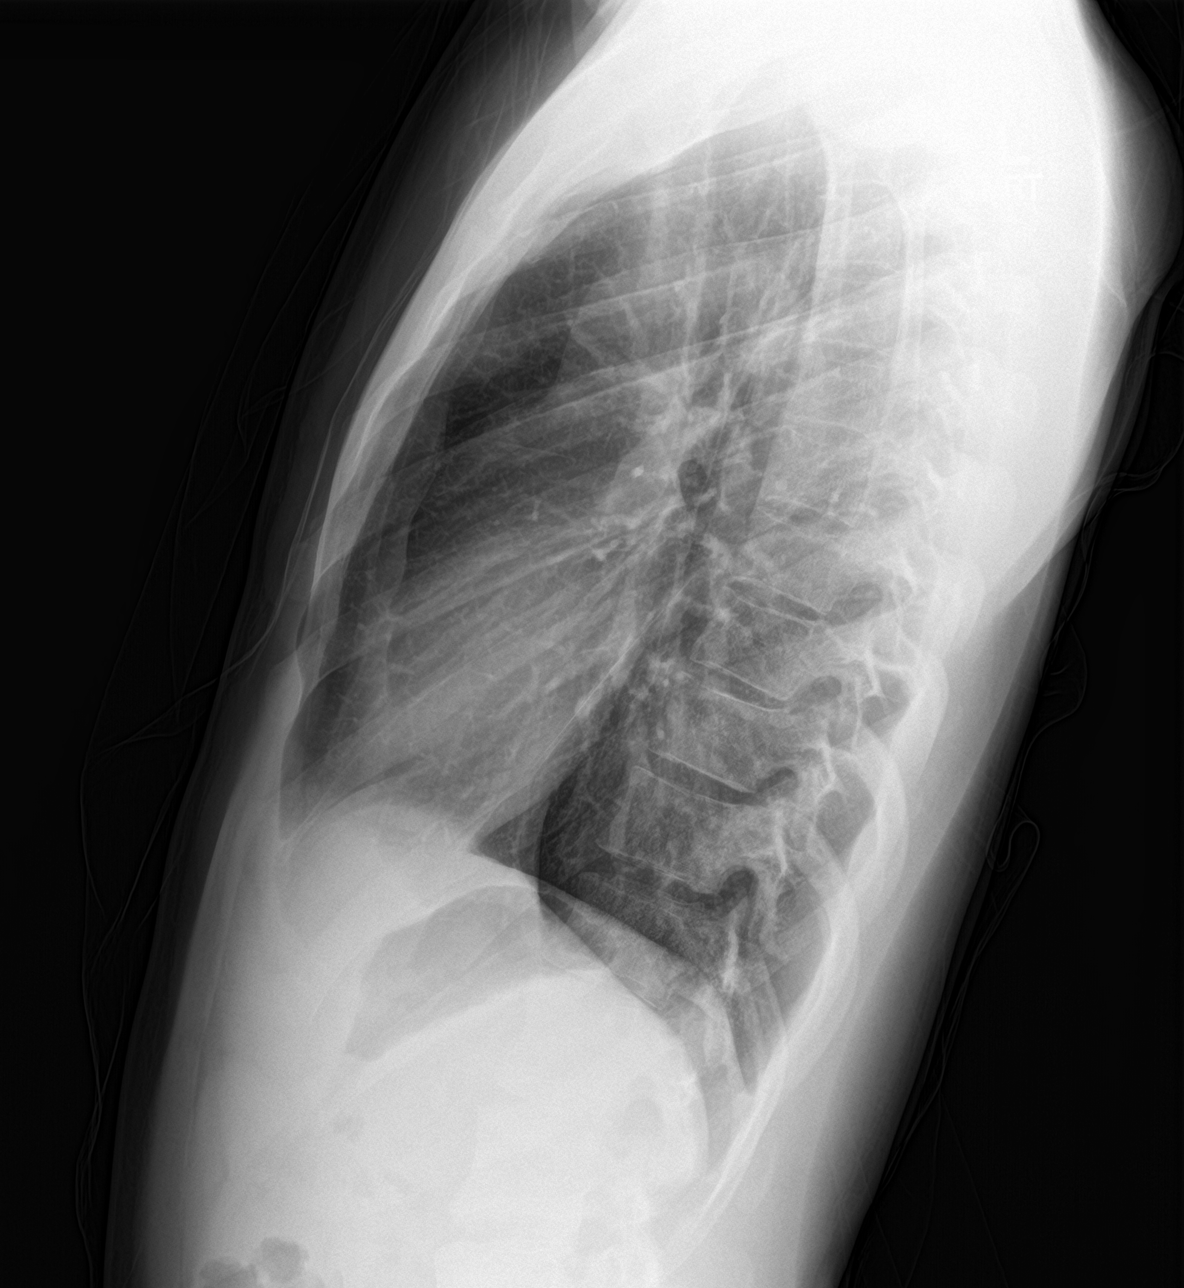

[3 of 3 positions shown; findings below may reference images not displayed]

FINDINGS: The lungs are mildly hyperinflated but clear. The heart and
pulmonary vascularity are normal. The mediastinum is normal in
width. No hilar or mediastinal lymphadenopathy is observed. There is
no pleural effusion. The bony thorax is unremarkable.
IMPRESSION: There is no active cardiopulmonary disease.

## 2017-04-30 ENCOUNTER — Emergency Department (HOSPITAL_COMMUNITY)
Admission: EM | Admit: 2017-04-30 | Discharge: 2017-05-01 | Disposition: A | Discharge status: Home / Self Care | Payer: Medicaid Other | Attending: Emergency Medicine | Admitting: Emergency Medicine

## 2017-04-30 ENCOUNTER — Encounter (HOSPITAL_COMMUNITY): Payer: Self-pay | Admitting: Emergency Medicine

## 2017-04-30 ENCOUNTER — Other Ambulatory Visit: Payer: Self-pay

## 2017-04-30 DIAGNOSIS — R112 Nausea with vomiting, unspecified: Secondary | ICD-10-CM | POA: Insufficient documentation

## 2017-04-30 LAB — CBC
HCT: 39.9 % (ref 39.0–52.0)
Hemoglobin: 13.2 g/dL (ref 13.0–17.0)
MCH: 30.4 pg (ref 26.0–34.0)
MCHC: 33.1 g/dL (ref 30.0–36.0)
MCV: 91.9 fL (ref 78.0–100.0)
PLATELETS: 273 10*3/uL (ref 150–400)
RBC: 4.34 MIL/uL (ref 4.22–5.81)
RDW: 11.9 % (ref 11.5–15.5)
WBC: 4.3 10*3/uL (ref 4.0–10.5)

## 2017-04-30 LAB — COMPREHENSIVE METABOLIC PANEL
ALBUMIN: 4.1 g/dL (ref 3.5–5.0)
ALK PHOS: 72 U/L (ref 38–126)
ALT: 18 U/L (ref 17–63)
AST: 22 U/L (ref 15–41)
Anion gap: 10 (ref 5–15)
BILIRUBIN TOTAL: 1.5 mg/dL — AB (ref 0.3–1.2)
BUN: 9 mg/dL (ref 6–20)
CO2: 24 mmol/L (ref 22–32)
Calcium: 9.2 mg/dL (ref 8.9–10.3)
Chloride: 105 mmol/L (ref 101–111)
Creatinine, Ser: 1.17 mg/dL (ref 0.61–1.24)
GFR calc Af Amer: >60 mL/min (ref >60)
GFR calc non Af Amer: >60 mL/min (ref >60)
GLUCOSE: 85 mg/dL (ref 65–99)
Potassium: 4.2 mmol/L (ref 3.5–5.1)
Sodium: 139 mmol/L (ref 135–145)
TOTAL PROTEIN: 8.3 g/dL — AB (ref 6.5–8.1)

## 2017-04-30 LAB — URINALYSIS, ROUTINE W REFLEX MICROSCOPIC
BILIRUBIN URINE: NEGATIVE
Glucose, UA: NEGATIVE mg/dL
Hgb urine dipstick: NEGATIVE
KETONES UR: NEGATIVE mg/dL
Leukocytes, UA: NEGATIVE
NITRITE: NEGATIVE
PH: 7 (ref 5.0–8.0)
PROTEIN: NEGATIVE mg/dL
Specific Gravity, Urine: 1.011 (ref 1.005–1.030)

## 2017-04-30 LAB — LIPASE, BLOOD: Lipase: 31 U/L (ref 11–51)

## 2017-04-30 MED ORDER — ONDANSETRON 4 MG PO TBDP: 4.0000 mg | ORAL_TABLET | Freq: Four times a day (QID) | ORAL | 0 refills | Status: DC | PRN

## 2017-04-30 MED ORDER — DICYCLOMINE HCL 10 MG PO CAPS
20.0000 mg | ORAL_CAPSULE | Freq: Once | ORAL | Status: AC
  Administered 2017-05-01: 20 mg via ORAL
  Filled 2017-04-30: qty 2

## 2017-04-30 MED ORDER — ONDANSETRON 4 MG PO TBDP
4.0000 mg | ORAL_TABLET | Freq: Once | ORAL | Status: AC
  Administered 2017-05-01: 4 mg via ORAL
  Filled 2017-04-30: qty 1

## 2017-04-30 MED ORDER — DICYCLOMINE HCL 20 MG PO TABS: 20.0000 mg | ORAL_TABLET | Freq: Three times a day (TID) | ORAL | 0 refills | Status: DC

## 2017-04-30 NOTE — ED Notes (Signed)
Writer provided pt with sprite for fluid challenge

## 2017-04-30 NOTE — ED Provider Notes (Signed)
TIME SEEN: 11:27 PM  CHIEF COMPLAINT: Nausea and vomiting  HPI: Patient is a 27 year old male with previous history of appendectomy who presents to the emergency department with complaints of nausea and vomiting for 2 days.  States mostly occurs after eating.  Has had some sharp diffuse abdominal pain but none currently.  No diarrhea.  No bloody stool or melena.  No dysuria or hematuria.  No fever or chills.  No sick contacts or recent travel.  Does not smoke marijuana.  ROS: See HPI Constitutional: no fever  Eyes: no drainage  ENT: no runny nose   Cardiovascular:  no chest pain  Resp: no SOB  GI: no vomiting GU: no dysuria Integumentary: no rash  Allergy: no hives  Musculoskeletal: no leg swelling  Neurological: no slurred speech ROS otherwise negative  PAST MEDICAL HISTORY/PAST SURGICAL HISTORY:  History reviewed. No pertinent past medical history.  MEDICATIONS:  Prior to Admission medications   Not on File    ALLERGIES:  No Known Allergies  SOCIAL HISTORY:  Social History   Tobacco Use  . Smoking status: Never Smoker  . Smokeless tobacco: Never Used  Substance Use Topics  . Alcohol use: Yes    Comment: occ    FAMILY HISTORY: No family history on file.  EXAM: BP 122/65 (BP Location: Right Arm)   Pulse 65   Temp 98.1 F (36.7 C) (Oral)   Resp 16   Ht 5\' 8"  (1.727 m)   Wt 58.1 kg (128 lb)   SpO2 100%   BMI 19.46 kg/m  CONSTITUTIONAL: Alert and oriented and responds appropriately to questions. Well-appearing; well-nourished HEAD: Normocephalic EYES: Conjunctivae clear, pupils appear equal, EOMI ENT: normal nose; moist mucous membranes NECK: Supple, no meningismus, no nuchal rigidity, no LAD  CARD: RRR; S1 and S2 appreciated; no murmurs, no clicks, no rubs, no gallops RESP: Normal chest excursion without splinting or tachypnea; breath sounds clear and equal bilaterally; no wheezes, no rhonchi, no rales, no hypoxia or respiratory distress, speaking full  sentences ABD/GI: Normal bowel sounds; non-distended; soft, non-tender, no rebound, no guarding, no peritoneal signs, no hepatosplenomegaly BACK:  The back appears normal and is non-tender to palpation, there is no CVA tenderness EXT: Normal ROM in all joints; non-tender to palpation; no edema; normal capillary refill; no cyanosis, no calf tenderness or swelling    SKIN: Normal color for age and race; warm; no rash NEURO: Moves all extremities equally PSYCH: The patient's mood and manner are appropriate. Grooming and personal hygiene are appropriate.  MEDICAL DECISION MAKING: Patient here with nausea vomiting.  No abdominal pain currently and his abdominal exam is completely benign.  Suspect possible viral illness, gastritis.  Labs including LFTs, lipase, creatinine, white blood cell count normal.  Urine shows no sign of infection and no sign of dehydration.  He states he is feeling better currently.  Will give oral Zofran and Bentyl and fluid challenge but I feel he is safe to be discharged.  I have recommended a bland diet for the next several days.  Discussed return precautions.  Patient comfortable with this plan.  Doubt colitis, diverticulitis, bowel obstruction, cholecystitis, pancreatitis based on his benign exam.  At this time, I do not feel there is any life-threatening condition present. I have reviewed and discussed all results (EKG, imaging, lab, urine as appropriate) and exam findings with patient/family. I have reviewed nursing notes and appropriate previous records.  I feel the patient is safe to be discharged home without further emergent workup and  can continue workup as an outpatient as needed. Discussed usual and customary return precautions. Patient/family verbalize understanding and are comfortable with this plan.  Outpatient follow-up has been provided if needed. All questions have been answered.      Keelon Zurn, Layla Maw, DO 04/30/17 2351

## 2017-04-30 NOTE — ED Notes (Signed)
Pt is aware that urine is needed, urine cup provided to pt by Clinical research associatewriter.

## 2017-04-30 NOTE — ED Notes (Addendum)
Alert, NAD, calm, interactive, resps e/u, speaking in clear complete sentences, no dyspnea noted, VSS. [facility]

## 2017-04-30 NOTE — ED Triage Notes (Signed)
Pt reports vomiting since Wednesday, no vomiting in triage. Pt denies pain. nad

## 2019-07-29 ENCOUNTER — Ambulatory Visit: Payer: Medicaid Other

## 2019-07-30 ENCOUNTER — Ambulatory Visit: Payer: Medicaid Other | Attending: Internal Medicine

## 2020-05-21 ENCOUNTER — Other Ambulatory Visit: Payer: Medicaid Other

## 2020-05-21 ENCOUNTER — Other Ambulatory Visit: Payer: Self-pay

## 2020-05-21 DIAGNOSIS — Z20822 Contact with and (suspected) exposure to covid-19: Secondary | ICD-10-CM

## 2020-05-22 LAB — NOVEL CORONAVIRUS, NAA: SARS-CoV-2, NAA: DETECTED — AB

## 2020-05-22 LAB — SARS-COV-2, NAA 2 DAY TAT

## 2020-05-23 ENCOUNTER — Telehealth: Payer: Self-pay | Admitting: *Deleted

## 2020-05-23 NOTE — Telephone Encounter (Signed)
Called to discuss with patient about COVID-19 symptoms and the use of one of the available treatments for those with mild to moderate Covid symptoms and at a high risk of hospitalization.  Pt appears to qualify for outpatient treatment due to co-morbid conditions and/or a member of an at-risk group in accordance with the FDA Emergency Use Authorization.    Symptom onset:  Vaccinated:  Booster?  Immunocompromised?  Qualifiers:   Unable to reach pt -No answer and unable to leave VM.  Sun Kihn Kaye   

## 2020-05-28 ENCOUNTER — Other Ambulatory Visit: Payer: Medicaid Other

## 2020-05-28 ENCOUNTER — Other Ambulatory Visit: Payer: Self-pay

## 2020-05-28 DIAGNOSIS — Z20822 Contact with and (suspected) exposure to covid-19: Secondary | ICD-10-CM

## 2020-05-29 LAB — SARS-COV-2, NAA 2 DAY TAT

## 2020-05-29 LAB — NOVEL CORONAVIRUS, NAA: SARS-CoV-2, NAA: NOT DETECTED

## 2021-05-27 ENCOUNTER — Other Ambulatory Visit: Payer: Self-pay

## 2021-05-27 ENCOUNTER — Emergency Department (HOSPITAL_COMMUNITY)
Admission: EM | Admit: 2021-05-27 | Discharge: 2021-05-27 | Disposition: A | Discharge status: Home / Self Care | Payer: Medicaid Other | Attending: Emergency Medicine | Admitting: Emergency Medicine

## 2021-05-27 ENCOUNTER — Encounter (HOSPITAL_COMMUNITY): Payer: Self-pay

## 2021-05-27 DIAGNOSIS — M545 Low back pain, unspecified: Secondary | ICD-10-CM | POA: Insufficient documentation

## 2021-05-27 LAB — URINALYSIS, ROUTINE W REFLEX MICROSCOPIC
Bilirubin Urine: NEGATIVE
Glucose, UA: NEGATIVE mg/dL
Hgb urine dipstick: NEGATIVE
Ketones, ur: NEGATIVE mg/dL
Leukocytes,Ua: NEGATIVE
Nitrite: NEGATIVE
Protein, ur: NEGATIVE mg/dL
Specific Gravity, Urine: 1.019 (ref 1.005–1.030)
pH: 5 (ref 5.0–8.0)

## 2021-05-27 MED ORDER — METHOCARBAMOL 500 MG PO TABS: 500.0000 mg | ORAL_TABLET | Freq: Two times a day (BID) | ORAL | 0 refills | Status: DC

## 2021-05-27 MED ORDER — KETOROLAC TROMETHAMINE 30 MG/ML IJ SOLN
30.0000 mg | Freq: Once | INTRAMUSCULAR | Status: AC
  Administered 2021-05-27: 30 mg via INTRAMUSCULAR
  Filled 2021-05-27: qty 1

## 2021-05-27 MED ORDER — METHOCARBAMOL 500 MG PO TABS
500.0000 mg | ORAL_TABLET | Freq: Once | ORAL | Status: AC
  Administered 2021-05-27: 500 mg via ORAL
  Filled 2021-05-27: qty 1

## 2021-05-27 NOTE — ED Notes (Signed)
Pt reports he still doesn't think he can urinate at this time and that he will try in a few.

## 2021-05-27 NOTE — ED Triage Notes (Signed)
Patient complains of lower back and bilateral flank pain x 2 days, reports urine looks dark and pain worse with change in position

## 2021-05-27 NOTE — ED Provider Notes (Signed)
[facility] EMERGENCY DEPARTMENT Provider Note   CSN: [id number] Arrival date & time: 05/27/21  [id number]     History  Chief Complaint  Patient presents with   Flank Pain    Jon Schwalbe Christoff Shimko. is a 31 y.o. male.  HPI  Patient is a 31 year old male presents with left-sided back pain.  States the pain has been present for 2 days.  Worsened with movement.  Described as achy and occasionally sharp.  Patient denies any recent trauma.  No radicular symptoms.  No radiation of pain.  No numbness or tingling.  No saddle anesthesia.  No urinary incontinence.  No dysuria or hematuria.  No fevers at home.     Home Medications Prior to Admission medications   Medication Sig Start Date End Date Taking? Authorizing Provider  methocarbamol (ROBAXIN) 500 MG tablet Take 1 tablet (500 mg total) by mouth 2 (two) times daily. 05/27/21  Yes Jacelyn Pi, MD  dicyclomine (BENTYL) 20 MG tablet Take 1 tablet (20 mg total) by mouth 3 (three) times daily before meals. Take as needed for abdominal cramps. 04/30/17   Ward, Delice Bison, DO  ondansetron (ZOFRAN ODT) 4 MG disintegrating tablet Take 1 tablet (4 mg total) by mouth every 6 (six) hours as needed for nausea or vomiting. 04/30/17   Ward, Delice Bison, DO      Allergies    Patient has no known allergies.    Review of Systems   Review of Systems  Constitutional:  Negative for chills and fever.  HENT:  Negative for ear pain and sore throat.   Eyes:  Negative for pain and visual disturbance.  Respiratory:  Negative for cough and shortness of breath.   Cardiovascular:  Negative for chest pain and palpitations.  Gastrointestinal:  Negative for abdominal pain and vomiting.  Genitourinary:  Positive for flank pain. Negative for dysuria and hematuria.  Musculoskeletal:  Positive for back pain. Negative for arthralgias.  Skin:  Negative for color change and rash.  Neurological:  Negative for seizures and syncope.  All other systems  reviewed and are negative.  Physical Exam Updated Vital Signs BP 103/78 (BP Location: Right Arm)    Pulse 71    Temp 98.1 F (36.7 C) (Oral)    Resp 17    Ht 5\' 8"  (1.727 m)    Wt 58.1 kg    SpO2 98%    BMI 19.48 kg/m  Physical Exam Vitals and nursing note reviewed.  Constitutional:      General: He is not in acute distress.    Appearance: He is well-developed.  HENT:     Head: Normocephalic and atraumatic.  Eyes:     Conjunctiva/sclera: Conjunctivae normal.  Cardiovascular:     Rate and Rhythm: Normal rate and regular rhythm.     Heart sounds: No murmur heard. Pulmonary:     Effort: Pulmonary effort is normal. No respiratory distress.     Breath sounds: Normal breath sounds.  Abdominal:     Palpations: Abdomen is soft.     Tenderness: There is no abdominal tenderness.  Musculoskeletal:        General: No swelling.     Cervical back: Neck supple.     Comments: Tenderness to palpation of the left paraspinal musculature.  No numbness or tingling bilaterally.  Reflexes are 2+ and symmetric.  Mild CVA tenderness on the left.  No pain on the right.  Skin:    General: Skin is warm and dry.  Capillary Refill: Capillary refill takes less than 2 seconds.  Neurological:     Mental Status: He is alert.  Psychiatric:        Mood and Affect: Mood normal.    ED Results / Procedures / Treatments   Labs (all labs ordered are listed, but only abnormal results are displayed) Labs Reviewed  URINALYSIS, ROUTINE W REFLEX MICROSCOPIC    EKG None  Radiology No results found.  Procedures Procedures    Medications Ordered in ED Medications  ketorolac (TORADOL) 30 MG/ML injection 30 mg (30 mg Intramuscular Given 05/27/21 0808)  methocarbamol (ROBAXIN) tablet 500 mg (500 mg Oral Given 05/27/21 0808)    ED Course/ Medical Decision Making/ A&P                           Medical Decision Making Risk Prescription drug management.   Jon Scarlet. presented today with  back pain.   Differential diagnosis includes but is not limited to msk back pain, kidney stone  Based on the presentation, labs were ordered.   ED provider interpretation of laboratory studies: No blood on UA, making kidney stone unlikely.    Decision Making: Patient presented with back pain. No urinary symptoms. Appears to be musculoskeletal pain on my exam, likely due to prolonged inactivity in setting of recent eye surgery. UA was ordered to screen for blood and it was negative, making kidney stone unlikely. Patient with improving symptoms after IM toradol and oral robaxin.   Patient to be discharged home. Discussed strict return precautions and the importance of outpatient follow up. Prescription for robaxin printed and provided to the patient.      Final Clinical Impression(s) / ED Diagnoses Final diagnoses:  Acute left-sided low back pain without sciatica    Rx / DC Orders ED Discharge Orders          Ordered    methocarbamol (ROBAXIN) 500 MG tablet  2 times daily        05/27/21 0912              Jacelyn Pi, MD 05/27/21 0945    Dorie Rank, MD 05/27/21 1601

## 2021-05-27 NOTE — Discharge Instructions (Addendum)
Please use ibuprofen and Tylenol for back pain.  Alternate every 3 hours between 400 mg of ibuprofen and 1000 mg of Tylenol.  Do not exceed 3200 mg of ibuprofen or 4000 mg of Tylenol per day.

## 2021-05-27 NOTE — ED Notes (Signed)
Reviewed discharge instructions with patient. Follow-up care and medications reviewed. Patient  verbalized understanding. Patient A&Ox4, VSS, and ambulatory with steady gait upon discharge.  °

## 2021-05-27 NOTE — ED Notes (Signed)
Pt aware of need for urine specimen. 

## 2021-07-15 ENCOUNTER — Encounter: Payer: Self-pay | Admitting: Internal Medicine

## 2021-07-15 ENCOUNTER — Ambulatory Visit: Payer: Medicaid Other | Admitting: Internal Medicine

## 2021-07-15 VITALS — BP 120/67 | HR 84 | Temp 98.2°F | Wt 160.7 lb

## 2021-07-15 DIAGNOSIS — Z114 Encounter for screening for human immunodeficiency virus [HIV]: Secondary | ICD-10-CM

## 2021-07-15 DIAGNOSIS — Z1159 Encounter for screening for other viral diseases: Secondary | ICD-10-CM | POA: Diagnosis not present

## 2021-07-15 DIAGNOSIS — J302 Other seasonal allergic rhinitis: Secondary | ICD-10-CM

## 2021-07-15 DIAGNOSIS — Z23 Encounter for immunization: Secondary | ICD-10-CM

## 2021-07-15 MED ORDER — LORATADINE 10 MG PO TABS: 10.0000 mg | ORAL_TABLET | Freq: Every day | ORAL | 1 refills | Status: AC | PRN

## 2021-07-15 MED ORDER — FLUTICASONE PROPIONATE 50 MCG/ACT NA SUSP: 1.0000 | Freq: Every day | NASAL | 2 refills | Status: AC

## 2021-07-15 NOTE — Progress Notes (Signed)
? ?CC: ear fullness and congestion, establishing care and needs age appropriate screening and vaccinations.  ? ?HPI:Mr.Jon Ibarra. is a 31 y.o. male who presents for evaluation of ear fullness and congestion, establishing care and needs age appropriate screening and vaccinations. This is his first visit in our clinic. He is accompanied by his mother who he lives with. He is a pleasant person and enjoys watching Anime in  his free time. He keeps his nephews during the day. 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 He has a good outlook , but wishes he have his own job and home at times. Has good support from family and friends. He had shingles around age 51 and had multiple eye surgeries on his left eye. Please see individual problem based A/P for details. ? ?Depression, PHQ-9: ?Based on the patients  ?Flowsheet Row Office Visit from 07/15/2021 in [facility]  ?PHQ-9 Total Score 3  ? ?  ? score we have does not suggest depression. ? ?Past Medical History:  ?Diagnosis Date  ? Shingles   ? ?Past Surgical History:  ?Procedure Laterality Date  ? LAPAROSCOPIC APPENDECTOMY N/A 10/17/2012  ? Procedure: APPENDECTOMY LAPAROSCOPIC;  Surgeon: Ardeth Sportsman, MD;  Location: [facility];  Service: General;  Laterality: N/A;  ? ?No Known Allergies ? ?No current daily medications ? ?Review of Systems:   ?Review of Systems  ?Constitutional:  Negative for chills and fever.  ?HENT:  Positive for congestion, ear pain, sinus pain and sore throat.   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 Negative for pain.  ?Respiratory:  Positive for cough. Negative for sputum production and shortness of breath.   ?Cardiovascular:  Negative for chest pain and leg swelling.  ?Gastrointestinal:  Negative for abdominal pain and vomiting.  ?Genitourinary:  Negative for dysuria and urgency.  ?Musculoskeletal:  Negative for falls and myalgias.   ?Neurological:  Negative for focal weakness and headaches.  ?Psychiatric/Behavioral:  Negative for depression and substance abuse.    ? ?Physical Exam: ?Vitals:  ? 07/15/21 1517  ?BP: 120/67  ?Pulse: 84  ?Temp: 98.2 ?F (36.8 ?C)  ?TempSrc: Oral  ?SpO2: 97%  ?Weight: 160 lb 11.2 oz (72.9 kg)  ? ?Physical Exam ?Constitutional:   ?   Appearance: Normal appearance. He is normal weight.  ?HENT:  ?   Right Ear: Tympanic membrane normal.  ?   Left Ear: Tympanic membrane normal.  ?   Nose: Congestion and rhinorrhea present.  ?   Mouth/Throat:  ?   Pharynx: Posterior oropharyngeal erythema present. No oropharyngeal exudate.  ?Eyes:  ?   General:     ?   Right eye: No discharge.     ?   Left eye: No discharge.  ?   Conjunctiva/sclera: Conjunctivae normal.  ?Cardiovascular:  ?   Rate and Rhythm: Normal rate and regular rhythm.  ?   Heart sounds: No murmur heard. ?Pulmonary:  ?   Effort: Pulmonary effort is normal. No respiratory distress.  ?   Breath sounds: Normal breath sounds. No wheezing or rales.  ?Abdominal:  ?   General: Bowel sounds are normal. There is no distension.  ?   Palpations: Abdomen is soft.  ?Musculoskeletal:     ?   General: No signs of injury.  ?   Right lower leg: No edema.  ?   Left lower leg: No edema.  ?Skin: ?   General: Skin is  warm and dry.  ?   Findings: No erythema or rash.  ?Neurological:  ?   Mental Status: He is alert and oriented to person, place, and time.  ?   Motor: No weakness.  ?Psychiatric:     ?   Mood and Affect: Mood normal.     ?   Behavior: Behavior normal.  ? ? ?  ? ? ?Assessment & Plan:  ? ?See Encounters Tab for problem based charting. ? ?Patient discussed with Dr. Cleda Daub ? ?

## 2021-07-15 NOTE — Patient Instructions (Addendum)
Thank you for trusting me with your care. To recap, today we discussed the following: ? ?1. Seasonal allergic rhinitis, unspecified trigger ?- loratadine (CLARITIN) 10 MG tablet; Take 1 tablet (10 mg total) by mouth daily as needed for allergies.  Dispense: 90 tablet; Refill: 1 ?- fluticasone (FLONASE) 50 MCG/ACT nasal spray; Place 1 spray into both nostrils daily.  Dispense: 15.8 mL; Refill: 2 ? ?2. Screening for HIV (human immunodeficiency virus) ?-Low risk ?- HIV antibody (with reflex) ? ?3. Need for hepatitis C screening test ?- Low risk ?- Hepatitis C Ab reflex to Quant PCR ? ?4. Need for Tdap vaccination ?- Tdap vaccine greater than or equal to 7yo IM ? ? ?If you have any questions or concerns, call our clinic at [phone] or after hours call [phone] and ask for the internal medicine resident on call. ? ?

## 2021-07-16 DIAGNOSIS — Z1159 Encounter for screening for other viral diseases: Secondary | ICD-10-CM | POA: Insufficient documentation

## 2021-07-16 DIAGNOSIS — J302 Other seasonal allergic rhinitis: Secondary | ICD-10-CM | POA: Insufficient documentation

## 2021-07-16 DIAGNOSIS — Z114 Encounter for screening for human immunodeficiency virus [HIV]: Secondary | ICD-10-CM | POA: Insufficient documentation

## 2021-07-16 DIAGNOSIS — Z23 Encounter for immunization: Secondary | ICD-10-CM | POA: Insufficient documentation

## 2021-07-16 LAB — HCV INTERPRETATION

## 2021-07-16 LAB — HIV ANTIBODY (ROUTINE TESTING W REFLEX): HIV Screen 4th Generation wRfx: NONREACTIVE

## 2021-07-16 LAB — HCV AB W REFLEX TO QUANT PCR: HCV Ab: NONREACTIVE

## 2021-07-16 NOTE — Assessment & Plan Note (Signed)
Low risk. HIV screening negative.  ?

## 2021-07-16 NOTE — Assessment & Plan Note (Signed)
Patient has been experiencing ear fullness and congestion for 2 days. Mom adds he has been having cough and runny nose for a few months. Exam is consistent with allergic rhinitis.  ? ? Seasonal allergic rhinitis, unspecified trigger ?- loratadine (CLARITIN) 10 MG tablet; Take 1 tablet (10 mg total) by mouth daily as needed for allergies.  Dispense: 90 tablet; Refill: 1 ?- fluticasone (FLONASE) 50 MCG/ACT nasal spray; Place 1 spray into both nostrils daily.  Dispense: 15.8 mL; Refill: 2 ?

## 2021-07-16 NOTE — Assessment & Plan Note (Signed)
Low risk patient .One time screening for Hep C non reactive.  ?

## 2021-07-16 NOTE — Assessment & Plan Note (Signed)
Due for TDAP , administered in clinic today. ?

## 2021-07-17 NOTE — Progress Notes (Signed)
Internal Medicine Clinic Attending  Case discussed with Dr. Steen  At the time of the visit.  We reviewed the resident's history and exam and pertinent patient test results.  I agree with the assessment, diagnosis, and plan of care documented in the resident's note.
